# Patient Record
Sex: Female | Born: 1990 | Race: White | Hispanic: No | Marital: Single | Smoking: Never smoker
Health system: Southern US, Community
[De-identification: ages and names within clinical notes are randomized; demographics above are authoritative.]

## PROBLEM LIST (undated history)

## (undated) DIAGNOSIS — F988 Other specified behavioral and emotional disorders with onset usually occurring in childhood and adolescence: Secondary | ICD-10-CM

## (undated) DIAGNOSIS — F419 Anxiety disorder, unspecified: Secondary | ICD-10-CM

## (undated) HISTORY — DX: Other specified behavioral and emotional disorders with onset usually occurring in childhood and adolescence: F98.8

## (undated) HISTORY — DX: Anxiety disorder, unspecified: F41.9

## (undated) HISTORY — PX: CHOLECYSTECTOMY, LAPAROSCOPIC: SHX56

## (undated) HISTORY — PX: OTHER SURGICAL HISTORY: SHX169

---

## 2016-07-19 ENCOUNTER — Ambulatory Visit (INDEPENDENT_AMBULATORY_CARE_PROVIDER_SITE_OTHER): Payer: BLUE CROSS/BLUE SHIELD | Admitting: Nurse Practitioner

## 2016-07-19 ENCOUNTER — Encounter: Payer: Self-pay | Admitting: Nurse Practitioner

## 2016-07-19 VITALS — BP 110/78 | HR 78 | Temp 98.1°F | Ht 63.0 in | Wt 149.0 lb

## 2016-07-19 DIAGNOSIS — Z23 Encounter for immunization: Secondary | ICD-10-CM

## 2016-07-19 DIAGNOSIS — Z0000 Encounter for general adult medical examination without abnormal findings: Secondary | ICD-10-CM | POA: Diagnosis not present

## 2016-07-19 NOTE — Progress Notes (Signed)
Subjective:    Patient ID: Audrey Malone, female    DOB: 02/28/1991, 26 y.o.   MRN: [id number]  Patient presents today for establish care (new patient)  HPI  [facility] [employer] [place] 04[place]2016   ADD: Previous pcp: Dr. Luis Abedriplet in [place].  Last seen 04/2016. Prescribed ritalin to manage anxiety/panic attacks secondary to ADD. Hx of anxiety and panic attacks, no improvement with antianxiety medications. Ritalin initiated at age of 26 per patient.  GYN in [place], last pap smear 2017 (normal per patient). Prescribed OCP. No refill needed at this time.  Acne: [facility]: acne  Since 01/2017. Labs done last week.  Immunizations: (TDAP, Hep C screen, Pneumovax, Influenza, zoster)  Health Maintenance  Topic Date Due  . Pap Smear  12/25/2013  . Flu Shot  07/23/2016*  . HIV Screening  06/23/2017*  . Tetanus Vaccine  07/20/2026  *Topic was postponed. The date shown is not the original due date.   Diet:regular Weight:  Wt Readings from Last 3 Encounters:  07/19/16 149 lb (67.6 kg)   Exercise:walking Fall Risk: Fall Risk  07/19/2016  Falls in the past year? No   Depression/Suicide: [facility] 07/19/2016  Decreased Interest 0  Down, Depressed, Hopeless 0  PHQ - 2 Score 0   Pap Smear (every 7632yrs for >21-29 without HPV, every 8740yrs for >30-55640yrs with HPV):up to date per patient, no hx of abnormal pap. Vision:up to date, last done 12/2015, corrective lens done. Dental:up to date, last done 04/2016 Sexual History (birth control, marital status, STD):single, sexually active  Medications and allergies reviewed with patient and updated if appropriate.  Patient Active Problem List   Diagnosis Date Noted  . Encounter for medical examination to establish care 07/19/2016    No current outpatient prescriptions on file prior to visit.   No current facility-administered medications on file prior to visit.     Past Medical  History:  Diagnosis Date  . ADD (attention deficit disorder)     History reviewed. No pertinent surgical history.  Social History   Social History  . Marital status: Single    Spouse name: N/A  . Number of children: N/A  . Years of education: N/A   Social History Main Topics  . Smoking status: Never Smoker  . Smokeless tobacco: Never Used  . Alcohol use No  . Drug use: No  . Sexual activity: Not Asked   Other Topics Concern  . None   Social History Narrative  . None    Family History  Problem Relation Age of Onset  . Factor V Leiden deficiency Father   . Thyroid disease Paternal Grandfather   . Cancer Maternal Grandmother     unknown         Review of Systems  Constitutional: Negative for fever, malaise/fatigue and weight loss.  HENT: Negative for congestion and sore throat.   Eyes:       Negative for visual changes  Respiratory: Negative for cough and shortness of breath.   Cardiovascular: Negative for chest pain, palpitations and leg swelling.  Gastrointestinal: Negative for blood in stool, constipation, diarrhea and heartburn.  Genitourinary: Negative for dysuria, frequency and urgency.  Musculoskeletal: Negative for falls, joint pain and myalgias.  Skin: Negative for rash.  Neurological: Negative for dizziness, sensory change and headaches.  Endo/Heme/Allergies: Does not bruise/bleed easily.  Psychiatric/Behavioral: Negative for depression, hallucinations, memory loss, substance abuse and suicidal ideas. The patient is not nervous/anxious and does not  have insomnia.     Objective:   Vitals:   07/19/16 1426  BP: 110/78  Pulse: 78  Temp: 98.1 F (36.7 C)    Body mass index is 26.39 kg/m.   Physical Examination:  Physical Exam  Constitutional: She is oriented to person, place, and time.  HENT:  Right Ear: External ear normal.  Left Ear: External ear normal.  Nose: Nose normal.  Mouth/Throat: Oropharynx is clear and moist. No oropharyngeal  exudate.  Eyes: No scleral icterus.  Neck: Normal range of motion. Neck supple.  Cardiovascular: Normal rate, regular rhythm and normal heart sounds.   Pulmonary/Chest: Effort normal and breath sounds normal.  Abdominal: Soft. Bowel sounds are normal.  Musculoskeletal: She exhibits no edema.  Lymphadenopathy:    She has no cervical adenopathy.  Neurological: She is alert and oriented to person, place, and time.  Skin: Skin is warm and dry.  Vitals reviewed.   ASSESSMENT and PLAN:  Aarohi was seen today for establish care.  Diagnoses and all orders for this visit:  Need for diphtheria-tetanus-pertussis (Tdap) vaccine, adult/adolescent -     Tdap vaccine greater than or equal to 7yo IM  Encounter for medical examination to establish care    No problem-specific Assessment & Plan notes found for this encounter.      Follow up: Return if symptoms worsen or fail to improve.  Alysia Penna, NP

## 2016-07-19 NOTE — Progress Notes (Signed)
Pre visit review using our clinic review tool, if applicable. No additional management support is needed unless otherwise documented below in the visit note. 

## 2016-07-19 NOTE — Patient Instructions (Addendum)
Please sign medical release to obtain records from previous pcp(Dr. Triplet), GYN (pap smear results) and [facility] (lab results).  Will need to review records from pcp prior prescribing Ritalin.  STOP act or [place] controlled substance website.

## 2016-07-22 ENCOUNTER — Encounter: Payer: Self-pay | Admitting: Nurse Practitioner

## 2016-07-22 DIAGNOSIS — G43909 Migraine, unspecified, not intractable, without status migrainosus: Secondary | ICD-10-CM | POA: Insufficient documentation

## 2016-07-22 DIAGNOSIS — F988 Other specified behavioral and emotional disorders with onset usually occurring in childhood and adolescence: Secondary | ICD-10-CM | POA: Insufficient documentation

## 2017-05-13 ENCOUNTER — Encounter (HOSPITAL_COMMUNITY): Payer: Self-pay | Admitting: Emergency Medicine

## 2017-05-13 DIAGNOSIS — R1031 Right lower quadrant pain: Secondary | ICD-10-CM | POA: Insufficient documentation

## 2017-05-13 DIAGNOSIS — R197 Diarrhea, unspecified: Secondary | ICD-10-CM | POA: Insufficient documentation

## 2017-05-13 DIAGNOSIS — N898 Other specified noninflammatory disorders of vagina: Secondary | ICD-10-CM | POA: Diagnosis not present

## 2017-05-13 DIAGNOSIS — Z79899 Other long term (current) drug therapy: Secondary | ICD-10-CM | POA: Insufficient documentation

## 2017-05-13 DIAGNOSIS — R112 Nausea with vomiting, unspecified: Secondary | ICD-10-CM | POA: Diagnosis not present

## 2017-05-13 LAB — COMPREHENSIVE METABOLIC PANEL
ALBUMIN: 3.9 g/dL (ref 3.5–5.0)
ALT: 16 U/L (ref 14–54)
ANION GAP: 10 (ref 5–15)
AST: 20 U/L (ref 15–41)
Alkaline Phosphatase: 69 U/L (ref 38–126)
BUN: 8 mg/dL (ref 6–20)
CHLORIDE: 109 mmol/L (ref 101–111)
CO2: 19 mmol/L — AB (ref 22–32)
Calcium: 9.1 mg/dL (ref 8.9–10.3)
Creatinine, Ser: 0.78 mg/dL (ref 0.44–1.00)
Glucose, Bld: 96 mg/dL (ref 65–99)
POTASSIUM: 3.7 mmol/L (ref 3.5–5.1)
Sodium: 138 mmol/L (ref 135–145)
Total Bilirubin: 0.7 mg/dL (ref 0.3–1.2)
Total Protein: 7.4 g/dL (ref 6.5–8.1)

## 2017-05-13 LAB — I-STAT BETA HCG BLOOD, ED (MC, WL, AP ONLY)

## 2017-05-13 LAB — CBC WITH DIFFERENTIAL/PLATELET
BASOS PCT: 0 %
Basophils Absolute: 0 10*3/uL (ref 0.0–0.1)
EOS ABS: 0.2 10*3/uL (ref 0.0–0.7)
Eosinophils Relative: 2 %
HEMATOCRIT: 39.5 % (ref 36.0–46.0)
HEMOGLOBIN: 13.2 g/dL (ref 12.0–15.0)
LYMPHS ABS: 2.5 10*3/uL (ref 0.7–4.0)
Lymphocytes Relative: 23 %
MCH: 31 pg (ref 26.0–34.0)
MCHC: 33.4 g/dL (ref 30.0–36.0)
MCV: 92.7 fL (ref 78.0–100.0)
MONO ABS: 0.6 10*3/uL (ref 0.1–1.0)
Monocytes Relative: 5 %
NEUTROS PCT: 70 %
Neutro Abs: 7.5 10*3/uL (ref 1.7–7.7)
Platelets: 308 10*3/uL (ref 150–400)
RBC: 4.26 MIL/uL (ref 3.87–5.11)
RDW: 14 % (ref 11.5–15.5)
WBC: 10.8 10*3/uL — ABNORMAL HIGH (ref 4.0–10.5)

## 2017-05-13 LAB — URINALYSIS, ROUTINE W REFLEX MICROSCOPIC
BILIRUBIN URINE: NEGATIVE
GLUCOSE, UA: NEGATIVE mg/dL
Ketones, ur: 5 mg/dL — AB
NITRITE: NEGATIVE
PROTEIN: NEGATIVE mg/dL
Specific Gravity, Urine: 1.023 (ref 1.005–1.030)
pH: 5 (ref 5.0–8.0)

## 2017-05-13 LAB — LIPASE, BLOOD: Lipase: 53 U/L — ABNORMAL HIGH (ref 11–51)

## 2017-05-13 NOTE — ED Triage Notes (Signed)
Patient reports mid abdominal pain with nausea , emesis and diarrhea onset last week , pt. added malodorous brown/red vaginal discharge this week , denies fever or chills .

## 2017-05-14 ENCOUNTER — Encounter (HOSPITAL_COMMUNITY): Payer: Self-pay | Admitting: Radiology

## 2017-05-14 ENCOUNTER — Emergency Department (HOSPITAL_COMMUNITY): Payer: PRIVATE HEALTH INSURANCE

## 2017-05-14 ENCOUNTER — Emergency Department (HOSPITAL_COMMUNITY)
Admission: EM | Admit: 2017-05-14 | Discharge: 2017-05-14 | Disposition: A | Discharge status: Home / Self Care | Payer: PRIVATE HEALTH INSURANCE | Attending: Emergency Medicine | Admitting: Emergency Medicine

## 2017-05-14 DIAGNOSIS — R1031 Right lower quadrant pain: Secondary | ICD-10-CM

## 2017-05-14 LAB — WET PREP, GENITAL
Clue Cells Wet Prep HPF POC: NONE SEEN
Sperm: NONE SEEN
TRICH WET PREP: NONE SEEN
Yeast Wet Prep HPF POC: NONE SEEN

## 2017-05-14 MED ORDER — HYDROCODONE-ACETAMINOPHEN 5-325 MG PO TABS
1.0000 | ORAL_TABLET | Freq: Once | ORAL | Status: DC
  Filled 2017-05-14: qty 1

## 2017-05-14 MED ORDER — ONDANSETRON HCL 4 MG PO TABS: 4.0000 mg | ORAL_TABLET | Freq: Three times a day (TID) | ORAL | 0 refills | Status: DC | PRN

## 2017-05-14 MED ORDER — SODIUM CHLORIDE 0.9 % IV BOLUS (SEPSIS)
1000.0000 mL | Freq: Once | INTRAVENOUS | Status: AC
  Administered 2017-05-14: 1000 mL via INTRAVENOUS

## 2017-05-14 MED ORDER — IOPAMIDOL (ISOVUE-300) INJECTION 61%
INTRAVENOUS | Status: AC
  Administered 2017-05-14: 100 mL
  Filled 2017-05-14: qty 100

## 2017-05-14 MED ORDER — DICYCLOMINE HCL 20 MG PO TABS: 20.0000 mg | ORAL_TABLET | Freq: Two times a day (BID) | ORAL | 0 refills | Status: DC | PRN

## 2017-05-14 MED ORDER — KETOROLAC TROMETHAMINE 30 MG/ML IJ SOLN
30.0000 mg | Freq: Once | INTRAMUSCULAR | Status: AC
  Administered 2017-05-14: 30 mg via INTRAVENOUS
  Filled 2017-05-14: qty 1

## 2017-05-14 MED ORDER — DICYCLOMINE HCL 10 MG/ML IM SOLN
20.0000 mg | Freq: Once | INTRAMUSCULAR | Status: AC
  Administered 2017-05-14: 20 mg via INTRAMUSCULAR
  Filled 2017-05-14: qty 2

## 2017-05-14 MED ORDER — ONDANSETRON HCL 4 MG/2ML IJ SOLN
4.0000 mg | Freq: Once | INTRAMUSCULAR | Status: AC
  Administered 2017-05-14: 4 mg via INTRAVENOUS
  Filled 2017-05-14: qty 2

## 2017-05-14 NOTE — ED Notes (Signed)
Per pt and family, pt has been dealing w/ presenting sx since August.  Pt also c/o vaginal discharge.  States that the last time she had vaginal discharge she was dx w/ BV.

## 2017-05-14 NOTE — Discharge Instructions (Signed)
The cause of your pain was not identified today.  It is very important that she get follow-up with your OB/GYN as well as your family doctor.  You will need to be seen by gastroenterology well.  You had a CT scan performed today that showed some fluid and inflammation in your abdomen that may be contributing to your symptoms.

## 2017-05-14 NOTE — ED Notes (Signed)
Pt given cup of water and is tolerating w/o difficulty.

## 2017-05-14 NOTE — ED Provider Notes (Signed)
TIME SEEN: 5:26 AM  CHIEF COMPLAINT: Intermittent abdominal pain, vomiting and diarrhea, vaginal bleeding or discharge  HPI: Patient is a 27 year old female with history of ADD who presents to the emergency department with complaints of intermittent abdominal pain that she describes as feeling like a "wave" of sharp and severe pain with cramping.  Pain is diffuse throughout the abdomen.  She has had symptoms intermittently since the summer 2018.  She has had intermittent vomiting and diarrhea.  Last episode of diarrhea was 3 days ago.  She has had 14 episodes of nonbloody, nonbilious vomiting today.  She also reports vaginal bleeding that started today.  Did have some brown discharge yesterday.  States she is due for her menstrual cycle tomorrow.  She has never been sexually active.  No history of pregnancy or STD.  She has had previous pelvic exams and bacterial vaginosis before.  No previous abdominal surgery.  States that she has an appointment with a gastroenterologist in February.  She has had normal gallbladder ultrasound and normal HIDA scan.  Denies bloody stools, melena, dysuria, hematuria.  ROS: See HPI Constitutional: no fever  Eyes: no drainage  ENT: no runny nose   Cardiovascular:  no chest pain  Resp: no SOB  GI: Diarrhea and vomiting GU: no dysuria Integumentary: no rash  Allergy: no hives  Musculoskeletal: no leg swelling  Neurological: no slurred speech ROS otherwise negative  PAST MEDICAL HISTORY/PAST SURGICAL HISTORY:  Past Medical History:  Diagnosis Date  . ADD (attention deficit disorder)   . ADD (attention deficit disorder)     MEDICATIONS:  Prior to Admission medications   Medication Sig Start Date End Date Taking? Authorizing Provider  ISOtretinoin (CLARAVIS PO) Take 30 mg by mouth 2 (two) times daily.    [provider]  methylphenidate 36 MG PO CR tablet Take 36 mg by mouth 2 (two) times daily.    [provider]  norethindrone-ethinyl  estradiol-iron (BLISOVI FE 1.5/30) 1.5-30 MG-MCG tablet Take 1 tablet by mouth daily.    [provider]    ALLERGIES:  No Known Allergies  SOCIAL HISTORY:  Social History   Tobacco Use  . Smoking status: Never Smoker  . Smokeless tobacco: Never Used  Substance Use Topics  . Alcohol use: No    FAMILY HISTORY: Family History  Problem Relation Age of Onset  . Factor V Leiden deficiency Father   . Thyroid disease Paternal Grandfather   . Cancer Maternal Grandmother        unknown     EXAM: BP 111/79 (BP Location: Right Arm)   Pulse 80   Temp 98.2 F (36.8 C) (Oral)   Resp 15   Ht 5\' 3"  (1.6 m)   Wt 68 kg (150 lb)   LMP 04/14/2017 (Approximate)   SpO2 98%   BMI 26.57 kg/m  CONSTITUTIONAL: Alert and oriented and responds appropriately to questions. Well-appearing; well-nourished HEAD: Normocephalic EYES: Conjunctivae clear, pupils appear equal, EOMI ENT: normal nose; moist mucous membranes NECK: Supple, no meningismus, no nuchal rigidity, no LAD  CARD: RRR; S1 and S2 appreciated; no murmurs, no clicks, no rubs, no gallops RESP: Normal chest excursion without splinting or tachypnea; breath sounds clear and equal bilaterally; no wheezes, no rhonchi, no rales, no hypoxia or respiratory distress, speaking full sentences ABD/GI: Normal bowel sounds; non-distended; soft, non-tender, no rebound, no guarding, no peritoneal signs, no hepatosplenomegaly GU:  Normal external genitalia. No lesions, rashes noted. Patient has small amount of dark red vaginal bleeding on  exam that clots or hemorrhage.  No vaginal discharge.  No adnexal tenderness, mass or fullness, no cervical motion tenderness. Cervix is not appear friable.  Cervix is closed.  Chaperone present for exam. BACK:  The back appears normal and is non-tender to palpation, there is no CVA tenderness EXT: Normal ROM in all joints; non-tender to palpation; no edema; normal capillary refill; no cyanosis, no calf tenderness  or swelling    SKIN: Normal color for age and race; warm; no rash NEURO: Moves all extremities equally PSYCH: The patient's mood and manner are appropriate. Grooming and personal hygiene are appropriate.  MEDICAL DECISION MAKING: Patient here with recurrent episodes of abdominal pain, nausea, vomiting and diarrhea.  Abdominal exam here is completely benign.  Labs show mild leukocytosis but otherwise unremarkable.  Lipase minimally elevated at 53 with normal LFTs.  Urine shows blood but this is likely from her vaginal bleeding.  No other sign of infection.  Wet prep is negative.  Pregnancy test negative.  At this time I do not feel she needs an emergent CT scan.  I agree with close GI follow-up.  We will treat symptomatically with IV fluids, Zofran, Toradol, Bentyl.  Patient and mother are comfortable with this plan.  I doubt acute appendicitis, colitis, diverticulitis, bowel obstruction based on her benign exam.  ED PROGRESS: 7:20 AM  Patient's wet prep is negative for any acute abnormality.  She does have white blood cells but no other abnormality.  Patient reports feeling better but on repeat abdominal exam she now has focal tenderness with voluntary guarding in the right lower quadrant at McBurney's point.  We will proceed with CT imaging to rule out appendicitis.  She had no adnexal tenderness or fullness on exam.  No  cervical motion tenderness.  Signed out to Dr. Madilyn Hookees to follow-up on CT imaging.  Patient and mother comfortable with plan.  She will be kept n.p.o. at this time.   I reviewed all nursing notes, vitals, pertinent previous records, EKGs, lab and urine results, imaging (as available).    Ward, Layla MawKristen N, DO 05/14/17 [phone]

## 2017-05-14 NOTE — ED Provider Notes (Signed)
Patient care assumed at 0730 pending CT abdomen.  CT abdomen obtained and is concerning for PID or ovarian torsion.  PID is not likely given patient's symptoms, history, benign pelvic examination and that she is not sexually active.  Pelvic ultrasound obtained and that is negative for ovarian mass or torsion.  Question if some of the free fluid and inflammation is secondary to underlying inflammatory bowel disease.  Counseled patient on importance of GI as well as OB/GYN follow-up as well as return precautions.   Tilden Fossaees, Sylis Ketchum, MD 05/14/17 1406

## 2017-05-14 NOTE — ED Notes (Signed)
Dr. Ward at bedside.

## 2017-05-14 NOTE — ED Notes (Signed)
PT COUGHED UP SOME PINK TINGED SPUTUM

## 2017-05-15 ENCOUNTER — Encounter: Payer: Self-pay | Admitting: Gastroenterology

## 2017-05-15 LAB — GC/CHLAMYDIA PROBE AMP (~~LOC~~) NOT AT ARMC
CHLAMYDIA, DNA PROBE: NEGATIVE
Neisseria Gonorrhea: NEGATIVE

## 2017-05-24 ENCOUNTER — Encounter: Payer: Self-pay | Admitting: Physician Assistant

## 2017-05-24 ENCOUNTER — Ambulatory Visit: Payer: PRIVATE HEALTH INSURANCE | Admitting: Physician Assistant

## 2017-05-24 ENCOUNTER — Other Ambulatory Visit (INDEPENDENT_AMBULATORY_CARE_PROVIDER_SITE_OTHER): Payer: PRIVATE HEALTH INSURANCE

## 2017-05-24 VITALS — BP 96/78 | HR 80 | Ht 64.0 in | Wt 158.4 lb

## 2017-05-24 DIAGNOSIS — K529 Noninfective gastroenteritis and colitis, unspecified: Secondary | ICD-10-CM

## 2017-05-24 DIAGNOSIS — R109 Unspecified abdominal pain: Secondary | ICD-10-CM

## 2017-05-24 DIAGNOSIS — R9389 Abnormal findings on diagnostic imaging of other specified body structures: Secondary | ICD-10-CM

## 2017-05-24 LAB — CBC WITH DIFFERENTIAL/PLATELET
BASOS ABS: 0 10*3/uL (ref 0.0–0.1)
BASOS PCT: 0.5 % (ref 0.0–3.0)
EOS ABS: 0.2 10*3/uL (ref 0.0–0.7)
Eosinophils Relative: 2.4 % (ref 0.0–5.0)
HEMATOCRIT: 40.8 % (ref 36.0–46.0)
Hemoglobin: 13.9 g/dL (ref 12.0–15.0)
LYMPHS ABS: 2.1 10*3/uL (ref 0.7–4.0)
Lymphocytes Relative: 30.7 % (ref 12.0–46.0)
MCHC: 34 g/dL (ref 30.0–36.0)
MCV: 93 fl (ref 78.0–100.0)
MONO ABS: 0.6 10*3/uL (ref 0.1–1.0)
Monocytes Relative: 8.6 % (ref 3.0–12.0)
NEUTROS ABS: 3.9 10*3/uL (ref 1.4–7.7)
NEUTROS PCT: 57.8 % (ref 43.0–77.0)
PLATELETS: 322 10*3/uL (ref 150.0–400.0)
RBC: 4.38 Mil/uL (ref 3.87–5.11)
RDW: 13.8 % (ref 11.5–15.5)
WBC: 6.7 10*3/uL (ref 4.0–10.5)

## 2017-05-24 LAB — HIGH SENSITIVITY CRP: CRP, High Sensitivity: 2.74 mg/L (ref 0.000–5.000)

## 2017-05-24 LAB — IGA: IGA: 146 mg/dL (ref 68–378)

## 2017-05-24 NOTE — Patient Instructions (Signed)
Please go to the basement level to have your labs drawn.   Take Bentyl 10 mg, 1 tablet every 6 hours as needed for abdominal pain'cramping, Eat a soft diet for now. You will be established with Dr. Scarlette Shorts. You may need a colonoscopy. We will determine that after the CT Enterography.    You have been scheduled for a CT scan of the abdomen and pelvis at [place] ([address]---this is in the same building as Press photographer).   You are scheduled on Thursday 06-01-2017 at 3:00 PM. You should arrive at 1:30 PM to your appointment time for registration. Please follow the written instructions below on the day of your exam:  WARNING: IF YOU ARE ALLERGIC TO IODINE/X-RAY DYE, PLEASE NOTIFY RADIOLOGY IMMEDIATELY AT [phone]! YOU WILL BE GIVEN A 13 HOUR PREMEDICATION PREP.  1) Do not eat after 11:00 am anything after  (4 hours prior to your test) 2) You have been given 2 bottles of oral contrast to drink. The solution may taste               better if refrigerated, but do NOT add ice or any other liquid to this solution. Shake             well before drinking.    Drink 1 bottle of contrast @ 1:00 Pm (2 hours prior to your exam)  Drink 1 bottle of contrast @ 2:00 PM (1 hour prior to your exam)  You may take any medications as prescribed with a small amount of water except for the following: Metformin, Glucophage, Glucovance, Avandamet, Riomet, Fortamet, Actoplus Met, Janumet, Glumetza or Metaglip. The above medications must be held the day of the exam AND 48 hours after the exam.  The purpose of you drinking the oral contrast is to aid in the visualization of your intestinal tract. The contrast solution may cause some diarrhea. Before your exam is started, you will be given a small amount of fluid to drink. Depending on your individual set of symptoms, you may also receive an intravenous injection of x-ray contrast/dye. Plan on being at [facility] for 30 minutes or  long, depending on the type of exam you are having performed.  If you have any questions regarding your exam or if you need to reschedule, you may call the CT department at [phone] between the hours of 8:00 am and 5:00 pm, Monday-Friday.  ________________________________________________________________________

## 2017-05-24 NOTE — Progress Notes (Signed)
Subjective:    Patient ID: Audrey Malone, female    DOB: 02/09/1991, 27 y.o.   MRN: [id number]  HPI Audrey Malone is a pleasant 27 year old white female, new to GI today, referred by emergency room/Deport/Audrey Malone after evaluation there on 01/20/2019Patient has not had any prior GI evaluation. She is generally in good health with history of ADD and migraines. Patient says she had acute onset of abdominal pain the evening prior to the ER visit. Pain was crampy and located more in the right side of her abdomen, she was uncomfortable all through that night. Following morning she tried to eat and pain became much worse immediately after eating. She went to the emergency room where she says she had about 14 episodes of vomiting which she says was because of the pain. She did not have any diarrhea at that time no fever or chills. She had CT of the abdomen and pelvis done which showed wall thickening in a loop of ileum in the right pelvis adjacent to a fluid cllection arising from the right adnexa. Question of PID was raised Patient had pelvic exam and STD workup all of which was negative. She then had pelvic ultrasound with Doppler imaging showing a normal right ovary, normal left ovary and moderate free pelvic fluid. Labs with a CBC of 10.9 hemoglobin 13.2 and chemistries unremarkable. Patient says that she has continued to have abdominal discomfort which has not been severe area she's been eating very bland. She has not had any fever or chills and no further nausea or vomiting. She has had a change in her bowel habits over the past 6 months or so with episodes of constipation and episodes of diarrhea. Currently she is feeling somewhat constipated and having small bowel movements. She says she has been feeling bloated in her abdomen and uncomfortable for several months. She had been on Accutane and in March 2018 the dose was increased. She says she started noticing abdominal bloating around that same time.  Eventually she was taken off of Accutane in June 2018 but the bloating and abdominal discomfort persisted. She says that she also had an eye infection in August and then ever since then has had somewhat irritated reddened eyes and has been unable to wear her contacts. She has seen an ophthalmologist and is scheduled to see someone in [place] in a couple of weeks. There's been some question of an allergic reaction. 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 Mother also relates that patient had abdominal ultrasound and then a HIDA scan done in [place] in November 2018 for the same symptoms.  Review of Systems Pertinent positive and negative review of systems were noted in the above HPI section.  All other review of systems was otherwise negative.  Outpatient Encounter Medications as of 05/24/2017  Medication Sig  . methylphenidate 36 MG PO CR tablet Take 36 mg by mouth 2 (two) times daily.  . norethindrone-ethinyl estradiol-iron (BLISOVI FE 1.5/30) 1.5-30 MG-MCG tablet Take 1 tablet by mouth daily.  Marland Kitchen. dicyclomine (BENTYL) 20 MG tablet Take 1 tablet (20 mg total) by mouth 2 (two) times daily as needed for spasms. (Patient not taking: Reported on 05/24/2017)  . ondansetron (ZOFRAN) 4 MG tablet Take 1 tablet (4 mg total) by mouth every 8 (eight) hours as needed for nausea or vomiting. (Patient not taking: Reported on 05/24/2017)  . [DISCONTINUED] ISOtretinoin (CLARAVIS PO) Take 30  mg by mouth 2 (two) times daily.   No facility-administered encounter medications on file as of 05/24/2017.    No Known Allergies Patient Active Problem List   Diagnosis Date Noted  . 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 Migraine 07/22/2016  . Encounter for medical examination to establish care 07/19/2016   Social History   Socioeconomic History  . Marital status: Single    Spouse  name: Not on file  . Number of children: 0  . Years of education: Not on file  . Highest education level: Not on file  Social Needs  . Financial resource strain: Not on file  . Food insecurity - worry: Not on file  . Food insecurity - inability: Not on file  . Transportation needs - medical: Not on file  . Transportation needs - non-medical: Not on file  Occupational History  . Occupation: Consulting civil engineer  Tobacco Use  . Smoking status: Never Smoker  . Smokeless tobacco: Never Used  Substance and Sexual Activity  . Alcohol use: No  . Drug use: No  . Sexual activity: Not on file  Other Topics Concern  . Not on file  Social History Narrative  . Not on file    Audrey Malone's family history includes Arthritis in her paternal grandmother; Cancer in her maternal grandmother; Factor V Leiden deficiency in her father; Heart attack in her maternal grandfather; Heart disease in her maternal grandfather; Hypercholesterolemia in her paternal grandmother; Thyroid disease in her paternal grandfather.      Objective:    Vitals:   05/24/17 1348  BP: 96/78  Pulse: 80    Physical Exam ;well-developed young white female in no acute distress, accompanied by her mother are both pleasant blood pressure 96/78 pulse 80, height 5 foot 4, weight 158, BMI 27.1. HEENT; nontraumatic normocephalic EOMI PERRLA sclera anicteric, Cardiovascular ;regular rate and rhythm with S1-S2 no murmur or gallop, Pulmonary; clear bilaterally, Abdomen;soft, she has a rather generalized tenderness, but is more tender in the right mid and right lower quadrant is no palpable mass no guarding or rebound bowel sounds are present, Rectal ;exam not done, Extremities ;no clubbing cyanosis or edema skin warm and dry, Neuropsych ;mood and affect appropriate       Assessment & Plan:   #38 27 year old white female with several month history of vague GI symptoms with abdominal discomfort and bloating some change in bowel habits, and now with  a more acute episode of right lower abdominal pain dictating ER visit on 05/14/2017. CT imaging showed wall thickening in a loop of ileum in the right pelvis and a pelvic fluid collection. GYN evaluation was negative including ultrasound with Dopplers and infectious workup Will need to rule out IBD/Crohn's, rule out autoimmune disease  #2 bilateral eye irritation times several months-awaiting ophthalmology evaluation in [facility]   nclear whether this could be associated with her GI symptoms I.e. Iritis or  Scleritis  #3 ADD  Plan;  CBC today, sedimentation rate, CRP, TTG, IgA and ANA Patient will be scheduled for CT enterography We discussed possible need for colonoscopy and intubation of the terminal ileum. This would be scheduled with Dr. Marina Goodell Patient has a prescription for Bentyl, she will use this on an when necessary basis and continue Tylenol as needed Continue soft blander  Diet  Kazuo Durnil S Immaculate Crutcher PA-C 05/24/2017   Cc: Anne Ng, NP

## 2017-05-25 LAB — TISSUE TRANSGLUTAMINASE, IGG: (tTG) Ab, IgG: 1 U/mL

## 2017-05-25 LAB — ANA: ANA: NEGATIVE

## 2017-05-25 LAB — SEDIMENTATION RATE: SED RATE: 9 mm/h (ref 0–20)

## 2017-05-25 NOTE — Progress Notes (Signed)
Assessment and plans reviewed  

## 2017-06-01 ENCOUNTER — Ambulatory Visit (INDEPENDENT_AMBULATORY_CARE_PROVIDER_SITE_OTHER)
Admission: RE | Admit: 2017-06-01 | Discharge: 2017-06-01 | Disposition: A | Discharge status: Home / Self Care | Payer: 59 | Source: Ambulatory Visit | Attending: Physician Assistant | Admitting: Physician Assistant

## 2017-06-01 DIAGNOSIS — R9389 Abnormal findings on diagnostic imaging of other specified body structures: Secondary | ICD-10-CM | POA: Diagnosis not present

## 2017-06-01 DIAGNOSIS — K529 Noninfective gastroenteritis and colitis, unspecified: Secondary | ICD-10-CM | POA: Diagnosis not present

## 2017-06-01 DIAGNOSIS — R109 Unspecified abdominal pain: Secondary | ICD-10-CM | POA: Diagnosis not present

## 2017-06-01 MED ORDER — IOPAMIDOL (ISOVUE-300) INJECTION 61%
100.0000 mL | Freq: Once | INTRAVENOUS | Status: AC | PRN
  Administered 2017-06-01: 100 mL via INTRAVENOUS

## 2017-06-09 ENCOUNTER — Ambulatory Visit: Payer: PRIVATE HEALTH INSURANCE | Admitting: Gastroenterology

## 2017-06-22 ENCOUNTER — Encounter: Payer: Self-pay | Admitting: Physician Assistant

## 2017-06-22 ENCOUNTER — Encounter: Payer: Self-pay | Admitting: Allergy

## 2017-06-22 ENCOUNTER — Ambulatory Visit: Payer: PRIVATE HEALTH INSURANCE | Admitting: Physician Assistant

## 2017-06-22 ENCOUNTER — Ambulatory Visit (INDEPENDENT_AMBULATORY_CARE_PROVIDER_SITE_OTHER): Payer: 59 | Admitting: Allergy

## 2017-06-22 VITALS — BP 102/68 | HR 76 | Temp 98.5°F | Resp 16 | Ht 63.75 in | Wt 159.2 lb

## 2017-06-22 VITALS — BP 92/58 | HR 76 | Ht 64.0 in | Wt 161.4 lb

## 2017-06-22 DIAGNOSIS — H5789 Other specified disorders of eye and adnexa: Secondary | ICD-10-CM | POA: Diagnosis not present

## 2017-06-22 DIAGNOSIS — R1031 Right lower quadrant pain: Secondary | ICD-10-CM

## 2017-06-22 DIAGNOSIS — R933 Abnormal findings on diagnostic imaging of other parts of digestive tract: Secondary | ICD-10-CM | POA: Diagnosis not present

## 2017-06-22 DIAGNOSIS — R140 Abdominal distension (gaseous): Secondary | ICD-10-CM | POA: Diagnosis not present

## 2017-06-22 DIAGNOSIS — T781XXA Other adverse food reactions, not elsewhere classified, initial encounter: Secondary | ICD-10-CM

## 2017-06-22 MED ORDER — DICYCLOMINE HCL 10 MG PO CAPS: ORAL_CAPSULE | ORAL | 2 refills | Status: DC

## 2017-06-22 NOTE — Patient Instructions (Addendum)
We have sent the following medications to your pharmacy for you to pick up at your convenience:[facility] [address]. 1. Bentyl ( Dicyclomine) 10 mg  Call back and ask to speak to Amy's nurse Beth, if you develop recurrent pain.  Colonoscopy with Dr. Marina GoodellPerry would be he next step.  [phone], choose option 2.

## 2017-06-22 NOTE — Patient Instructions (Addendum)
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  All other foods tested are negative.       - it is not likely that garlic ingestion is leading to GI symptoms as if true IgE allergy you should have symptoms (respiratory, GI, skin or cardiovascular) after every ingestion of garlic    - can trial garlic out of the diet for about a month a see if this improves your GI symptoms.  If not would put back into diet.      - agree with further work-up with GI including colonoscopy  Red and dry eyes    - environmental allergy skin testing is positive to grass pollen.      - Allergen avoidance measures provided    - trial use of Opcon-A or Naphcon-A 1-2 drops per eye up to 4 times a day  Follow-up 6 months or sooner for eye symptoms

## 2017-06-22 NOTE — Progress Notes (Signed)
New Patient Note  RE: Audrey Malone MRN: [id number] DOB: 27-Nov-1990 Date of Office Visit: 06/22/2017  Referring provider: Leisa Lenz Primary care provider: Leisa Lenz  Chief Complaint: Food Allergy Testing  History of present illness: Audrey Malone is a 27 y.o. female presenting today for evaluation of possible food allergy. She reports a one year history of abdominal bloating and lower abdominal crampy pain. She has been unable to correlate her symptoms to any foods but does note that her symptoms do seem to be triggered by eating. She denies any hives, throat swelling, wheezing, shortness of breath, lip swelling, lightheadedness or syncope. She does note an erythematous macular rash that appears on her upper arms and legs at times that is pruritic and sometimes painful. She reports she has had one episode of nausea and vomiting when she presented to the ED earlier this year in January. She denies any hematochezia, melena, or diarrhea. Does note constipation. She does note that she was on Accutane with an increase in dosing around the time of her symptom presentation but has been off Accutane since June 2018 with no improvement in symptoms. She has been evaluated by GI and noted to have thickening in a loop of ileum and a pelvic fluid collection. GYN work up was negative. She has had normal ESR, CRP, ANA, IgA, tTG Ab.  She does have bentyl to use as needed for abd pain.  Additionally, she notes dry and red eyes that she has been seen by optometry for and has tried 3 separate eye drops (she is unclear what they were) without benefit.   Review of systems: Review of Systems  Constitutional: Negative for chills, fever, malaise/fatigue and weight loss.  HENT: Negative for congestion, ear discharge, ear pain, nosebleeds, sinus pain and sore throat.   Eyes: Positive for redness. Negative for blurred vision, double vision, photophobia, pain and discharge.  Respiratory: Negative for cough,  shortness of breath and wheezing.   Cardiovascular: Negative for chest pain.  Gastrointestinal: Positive for abdominal pain and constipation. Negative for blood in stool, diarrhea, heartburn, melena, nausea and vomiting.  Musculoskeletal: Negative for joint pain and myalgias.  Skin: Positive for itching and rash.  Neurological: Negative for headaches.    All other systems negative unless noted above in HPI  Past medical history: Past Medical History:  Diagnosis Date  . ADD (attention deficit disorder)   . Anxiety     Past surgical history: Past Surgical History:  Procedure Laterality Date  . None      Family history:  Family History  Problem Relation Age of Onset  . Factor V Leiden deficiency Father   . Thyroid disease Paternal Grandfather   . Cancer Maternal Grandmother        bladder  . Food Allergy Mother        fish  . Heart disease Maternal Grandfather   . Heart attack Maternal Grandfather   . Arthritis Paternal Grandmother   . Hypercholesterolemia Paternal Grandmother   . Colon cancer Neg Hx   . Liver cancer Neg Hx   . Allergic rhinitis Neg Hx   . Asthma Neg Hx   . Eczema Neg Hx   . Urticaria Neg Hx   . Angioedema Neg Hx     Social history: She lives in an apartment with carpeting with electric heating and central cooling.  No pets in the home.  No concern for water damage, mildew or roaches in the home.  She is a Insurance underwriter.  Denies smoking history.    Medication List: Allergies as of 06/22/2017   No Known Allergies     Medication List        Accurate as of 06/22/17 12:01 PM. Always use your most recent med list.          BLISOVI FE 1.5/30 1.5-30 MG-MCG tablet Generic drug:  norethindrone-ethinyl estradiol-iron Take 1 tablet by mouth daily.   dicyclomine 20 MG tablet Commonly known as:  BENTYL Take 1 tablet (20 mg total) by mouth 2 (two) times daily as needed for spasms.   methylphenidate 36 MG CR tablet Commonly known as:  CONCERTA Take  36 mg by mouth 2 (two) times daily.   ondansetron 4 MG tablet Commonly known as:  ZOFRAN Take 1 tablet (4 mg total) by mouth every 8 (eight) hours as needed for nausea or vomiting.       Known medication allergies: No Known Allergies   Physical examination: Blood pressure 102/68, pulse 76, temperature 98.5 F (36.9 C), temperature source Oral, resp. rate 16, height 5' 3.75" (1.619 m), weight 159 lb 3.2 oz (72.2 kg).  General: Alert, interactive, in no acute distress. HEENT: PERRLA, TMs pearly gray without scleral or conjunctiva injection, turbinates non-edematous without discharge, post-pharynx non erythematous. Neck: Supple without lymphadenopathy. Lungs: Clear to auscultation without wheezing, rhonchi or rales. {no increased work of breathing. CV: Normal S1, S2 without murmurs. Abdomen: Nondistended, nontender. Skin: Warm and dry, without lesions or rashes. Extremities:  No clubbing, cyanosis or edema. Neuro:   Grossly intact.  Diagnositics/Labs: Labs:  Component     Latest Ref Rng & Units 05/24/2017  WBC     4.0 - 10.5 K/uL 6.7  RBC     3.87 - 5.11 Mil/uL 4.38  Hemoglobin     12.0 - 15.0 g/dL 13.9  HCT     36.0 - 46.0 % 40.8  MCV     78.0 - 100.0 fl 93.0  MCHC     30.0 - 36.0 g/dL 34.0  RDW     11.5 - 15.5 % 13.8  Platelets     150.0 - 400.0 K/uL 322.0  Neutrophils     43.0 - 77.0 % 57.8  Lymphocytes     12.0 - 46.0 % 30.7  Monocytes Relative     3.0 - 12.0 % 8.6  Eosinophil     0.0 - 5.0 % 2.4  Basophil     0.0 - 3.0 % 0.5  NEUT#     1.4 - 7.7 K/uL 3.9  Lymphocyte #     0.7 - 4.0 K/uL 2.1  Monocyte #     0.1 - 1.0 K/uL 0.6  Eosinophils Absolute     0.0 - 0.7 K/uL 0.2  Basophils Absolute     0.0 - 0.1 K/uL 0.0  Sed Rate     0 - 20 mm/h 9  Anit Nuclear Antibody(ANA)     NEGATIVE NEGATIVE  IgA     68 - 378 mg/dL 146  C-Reactive Protein, Cardiac     0.000 - 5.000 mg/L 2.740  (tTG) Ab, IgG     U/mL 1    Allergy testing: environmental  allergy skin prick testing is positive to [place].   Select food allergy testing is slightly positive to garlic with erythema only Allergy testing results were read and interpreted by provider, documented by clinical staff.   Assessment and plan:   Adverse food reaction    - food allergy testing today is  slightly positive for garlic.  All other foods tested are negative.       - it is not likely that garlic ingestion is leading to GI symptoms as if true IgE allergy you should have symptoms (respiratory, GI, skin or cardiovascular) after every ingestion of garlic    - can trial garlic out of the diet for about a month a see if this improves your GI symptoms.  If not would put back into diet.      - agree with further work-up with GI including colonoscopy  Red and dry eyes    - environmental allergy skin testing is positive to grass pollen.      - Allergen avoidance measures provided    - trial use of Opcon-A or Naphcon-A 1-2 drops per eye up to 4 times a day  Follow-up 6 months or sooner for eye symptoms  I appreciate the opportunity to take part in Audrey Malone's care. Please do not hesitate to contact me with questions.  Sincerely,   Prudy Feeler, MD Allergy/Immunology [facility]

## 2017-06-23 ENCOUNTER — Encounter: Payer: Self-pay | Admitting: Physician Assistant

## 2017-06-23 NOTE — Progress Notes (Signed)
Subjective:    Patient ID: Audrey Malone, female    DOB: 02/13/1991, 27 y.o.   MRN: [id number]  HPI Audrey Malone is a pleasant 27 year old white female, who was initially seen in the office on 05/24/2017 by myself. Also established with Dr. Marina GoodellPerry. At that time she had presented with 1 month history of somewhat vague GI symptoms with abdominal discomfort which were then followed by an acute episode of fairly intense right lower quadrant pain. She had undergone CT of the abdomen and pelvis with an ER visit which did show wall thickening in a loop of ileum located in the right pelvis and there was also a pelvic fluid collection arising from the area of the right adnexa. There is no similar inflammation elsewhere in the bowel no other abnormal bowel loops. No Obstruction. The right ovary appeared ill-defined but not enlarged, appendix normal. She was referred to GYN. And says that she had GYN evaluation which was unremarkable. She had pelvic ultrasound also done on 05/14/2017 that showed moderate free pelvic fluid as on CT but otherwise normal, but otherwise normal ultrasound. After being seen here she was started on Bentyl, Tylenol and a bland diet. We did baseline labs which were negative, also did ANA, TTG and IgA all of which were negative. She was scheduled for CT enterography which was done on 06/01/2017 and read as normal. She comes back in today for follow-up. She says she has been very careful about her diet and has eliminated multiple things including gr, lettuce ice cream soda chocolate and says that is helped with the bloating. She also saw an allergist today for food allergy testing and says that she was only slightly positive to garlic. Skin is use to have some difficulty with abdominal bloating, is unhappy that she has gained some weight. She is not having any further episodes of diarrhea and is having one bowel movement per day. She denies any significant abdominal pain. She has been using MiraLAX  occasionally.  Review of Systems Pertinent positive and negative review of systems were noted in the above HPI section.  All other review of systems was otherwise negative.  Outpatient Encounter Medications as of 06/22/2017  Medication Sig  . dicyclomine (BENTYL) 20 MG tablet Take 1 tablet (20 mg total) by mouth 2 (two) times daily as needed for spasms.  . methylphenidate 36 MG PO CR tablet Take 36 mg by mouth 2 (two) times daily.  . norethindrone-ethinyl estradiol-iron (BLISOVI FE 1.5/30) 1.5-30 MG-MCG tablet Take 1 tablet by mouth daily.  . ondansetron (ZOFRAN) 4 MG tablet Take 1 tablet (4 mg total) by mouth every 8 (eight) hours as needed for nausea or vomiting.  . dicyclomine (BENTYL) 10 MG capsule Take 1 tab every 6 hours as needed for abdominal cramping   No facility-administered encounter medications on file as of 06/22/2017.    No Known Allergies Patient Active Problem List   Diagnosis Date Noted  . 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 Migraine 07/22/2016  . Encounter for medical examination to establish care 07/19/2016   Social History   Socioeconomic History  . Marital status: Single    Spouse name: Not on file  . Number of children: 0  . Years of education: Not on file  . Highest education level: Not on file  Social Needs  . Financial resource strain: Not on file  . Food insecurity - worry: Not on file  . Food insecurity - inability: Not on file  . Transportation  needs - medical: Not on file  . Transportation needs - non-medical: Not on file  Occupational History  . Occupation: Consulting civil engineer  Tobacco Use  . Smoking status: Never Smoker  . Smokeless tobacco: Never Used  Substance and Sexual Activity  . Alcohol use: No  . Drug use: No  . Sexual activity: Not on file  Other Topics Concern  . Not on file  Social History Narrative  . Not on file    Audrey Malone's family history includes Arthritis in her paternal grandmother; Cancer in her  maternal grandmother; Factor V Leiden deficiency in her father; Food Allergy in her mother; Heart attack in her maternal grandfather; Heart disease in her maternal grandfather; Hypercholesterolemia in her paternal grandmother; Thyroid disease in her paternal grandfather.      Objective:    Vitals:   06/22/17 1501  BP: (!) 92/58  Pulse: 76    Physical Exam  well-developed young white female in no acute distress, 6 accompanied by her mother both pleasant blood pressure 92/58, height 5 foot 4, weight 161, BMI 27.7. HEENT ;nontraumatic normocephalic EOMI PERRLA sclera anicteric, Cardiovascular; regular rate and rhythm with S1-S2 no murmur rub or gallop, Pulmonary; clear bilaterally, Abdomen ;soft, bowel sounds are present there is no palpable mass or hepatosplenomegaly she does have some tenderness in the right mid and right lower quadrant. Rectal ;exam not done, Ext; no clubbing cyanosis or edema skin warm and dry, Neuropsych ;mood and affect appropriate       Assessment & Plan:   #71 27 year old female with 2 month history of negative abdominal discomfort, associated bloating and then an episode of severe right lower quadrant pain which took her to the ER. CT scan at that time done on 05/14/2017 showed wall thickening in a loop of ileum in the right pelvis and an adjacent pelvic fluid collection in the right adnexa. Pelvic ultrasound was negative for any ovarian pathology. CT enterography done 3 weeks later on 06/01/2017 was read as normal. Inflammatory markers and celiac markers negative. Patient continues to have mild abdominal bloating, no diarrhea However she is still definitely tender in the right lower and right mid quadrant. Etiology is not clear, no evidence of Crohn's disease by enterography or other small bowel edema.  #2 ADD #3 migraines  Plan; I discussed options with the patient and her mother. We could proceed with colonoscopy and intubation of the terminal ileum, versus  watchful waiting in the short-term. Patient would like to hold off on colonoscopy and see how she does over the next month or 2. We will refill Bentyl 10 mg by mouth 3 times a day on an as-needed basis. She'll continue to avoid known triggers for her bloating. She is asked to call should she develop any recurrence of ongoing right lower quadrant pain. At that time we  may need to repeat CT  imaging and/or proceed with colonoscopy.   Amy S Esterwood PA-C 06/23/2017   Cc: Anne Ng, NP

## 2017-06-23 NOTE — Progress Notes (Signed)
Assessment and plans reviewed  

## 2018-08-08 IMAGING — CT CT ENTEROGRAPHY (ABD-PELV W/ CM)
2 of 5 series · 15 of 46 positions shown, 17 images · IV contrast (ISOVUE 300)
Comparison: 05/14/2017

CLINICAL DATA: Severe abdominal pain and nausea and vomiting for 1
month. Intermittent diarrhea. Crohn's disease.

EXAM:
CT ABDOMEN AND PELVIS WITH CONTRAST (ENTEROGRAPHY)
TECHNIQUE: Multidetector CT of the abdomen and pelvis during bolus
administration of intravenous contrast. Negative oral contrast was
given.
CONTRAST:  100mL O7WVE5-RSS IOPAMIDOL (O7WVE5-RSS) INJECTION 61%

[Series 4: entero thins · axial · 0.73mm/px · z∈[-465,-17]mm · 12 of 250 slices shown, 14 images]
[im 13/250  soft-tissue]
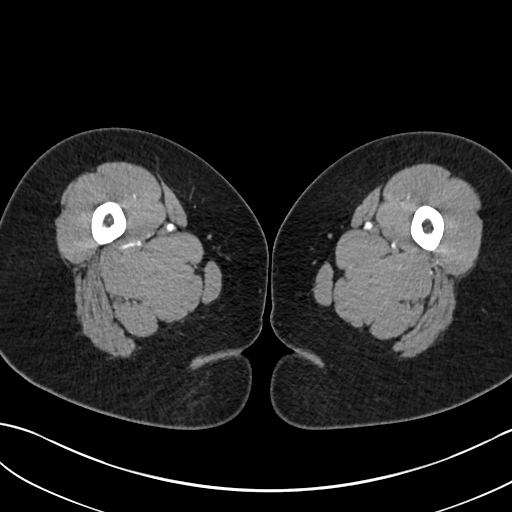
[im 13/250  bone]
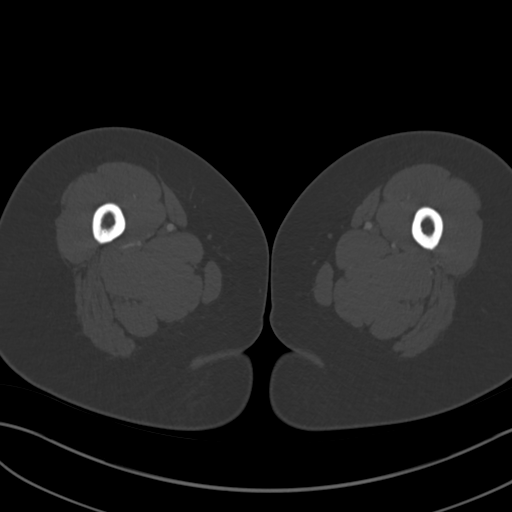
[im 38/250  soft-tissue]
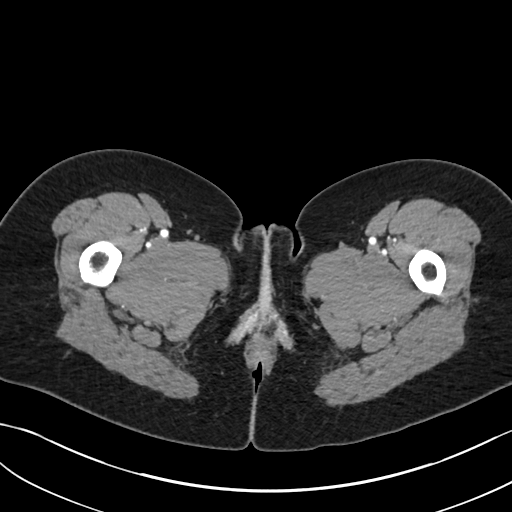
[im 50/250  soft-tissue]
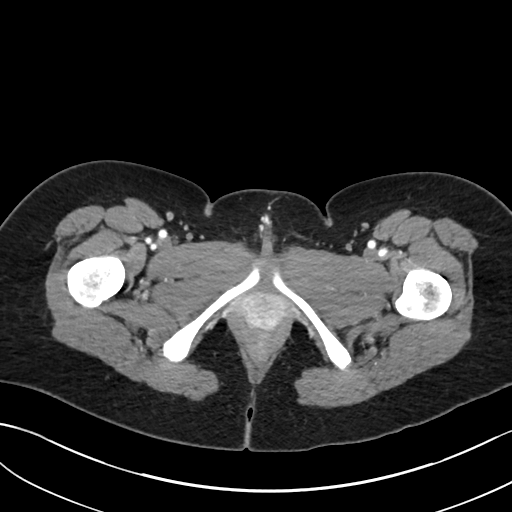
[im 75/250  soft-tissue]
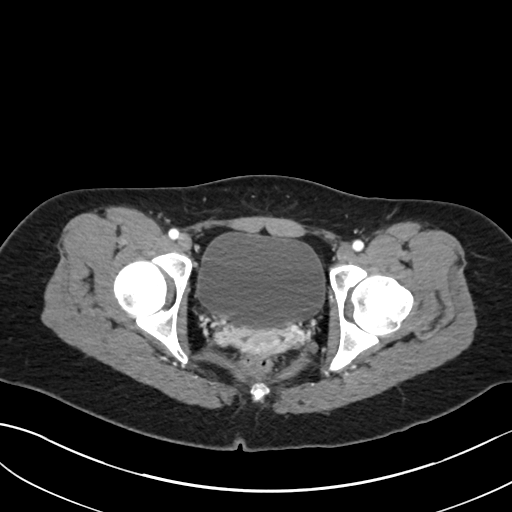
[im 100/250  soft-tissue]
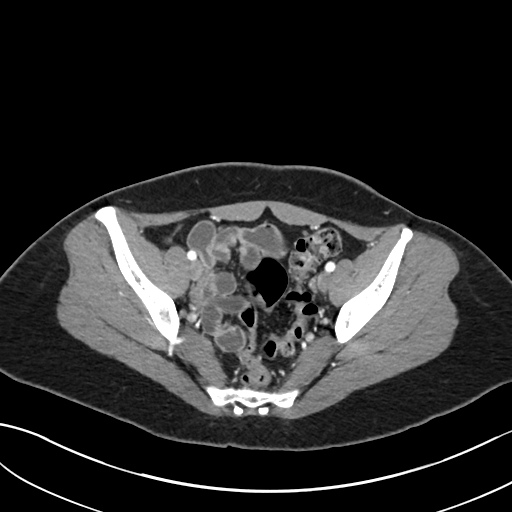
[im 113/250  soft-tissue]
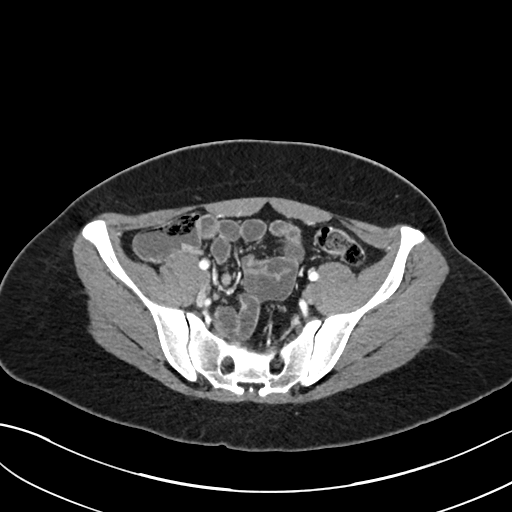
[im 137/250  soft-tissue]
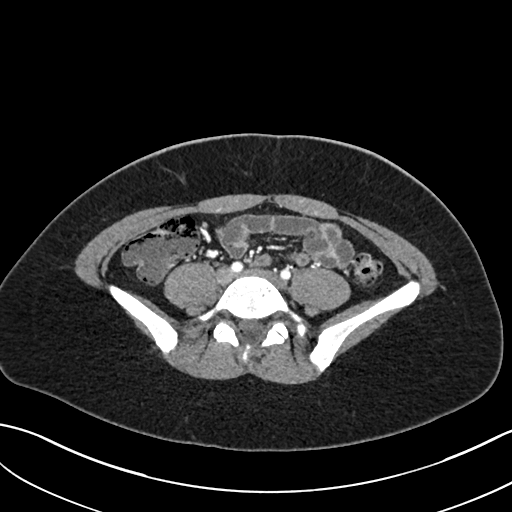
[im 150/250  soft-tissue]
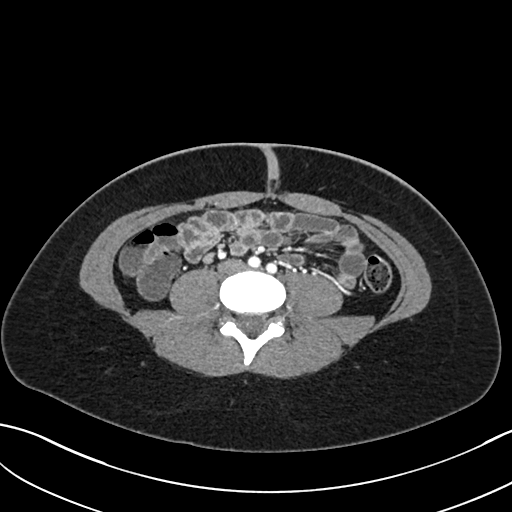
[im 175/250  soft-tissue]
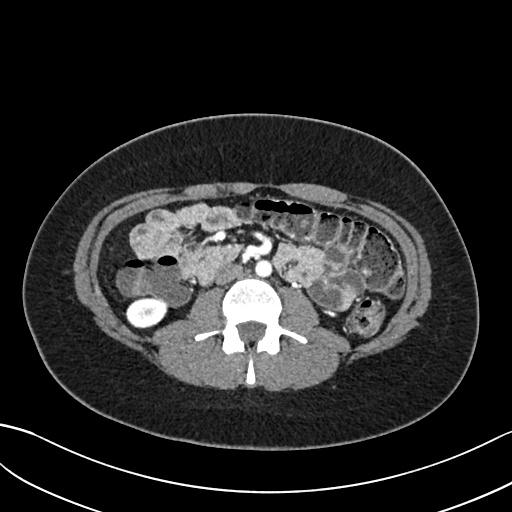
[im 175/250  bone]
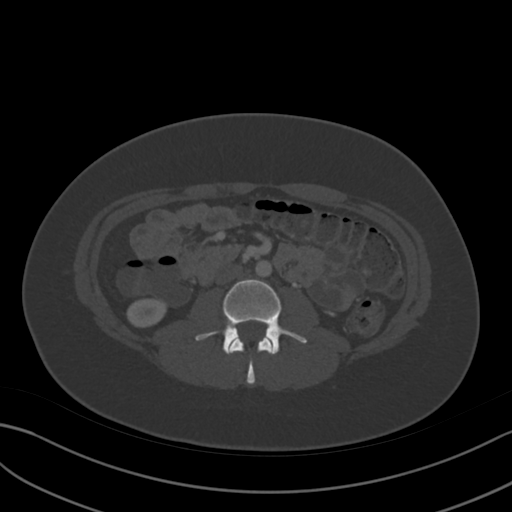
[im 200/250  soft-tissue]
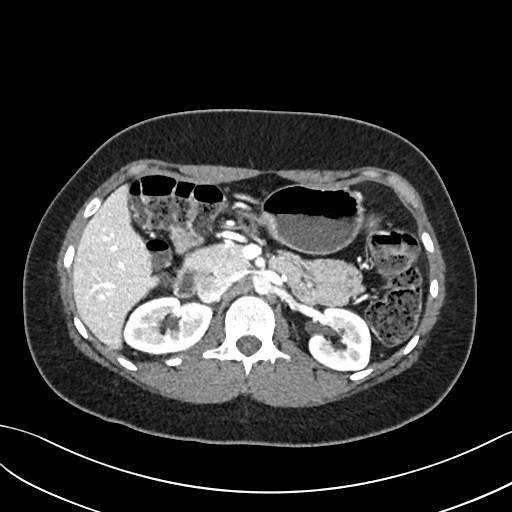
[im 212/250  soft-tissue]
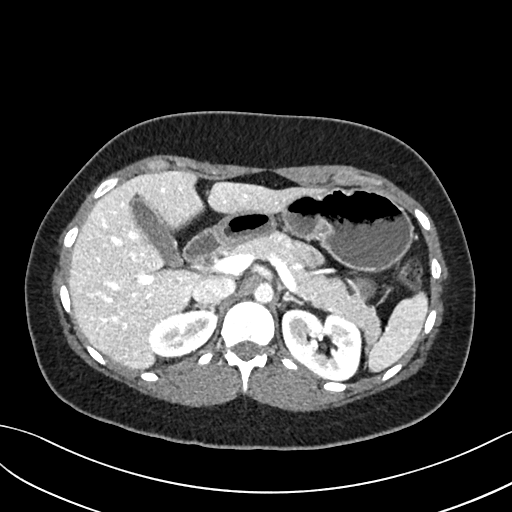
[im 237/250  soft-tissue]
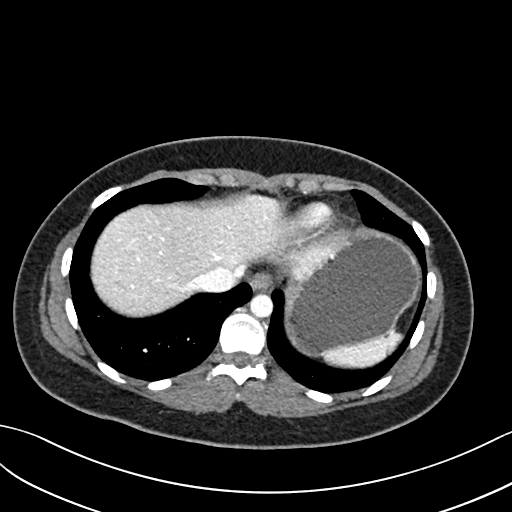

[Series 6: coronal · coronal · 0.62mm/px · 3 of 62 slices shown]
[im 21/62  soft-tissue]
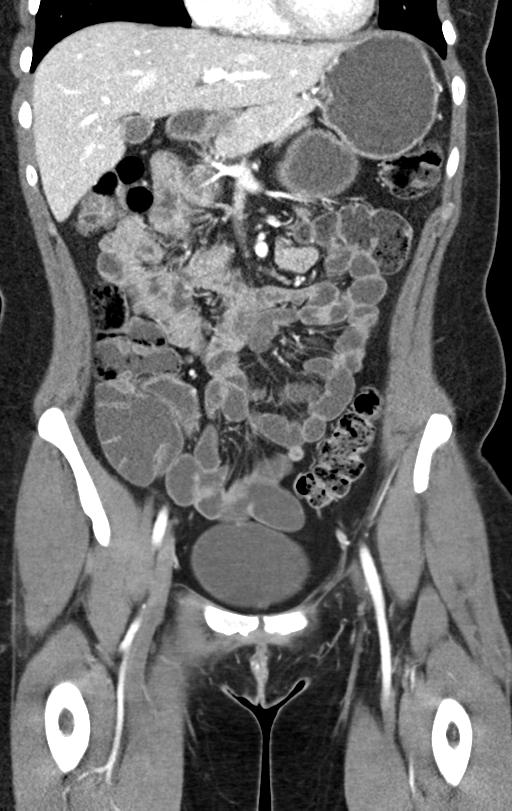
[im 28/62  soft-tissue]
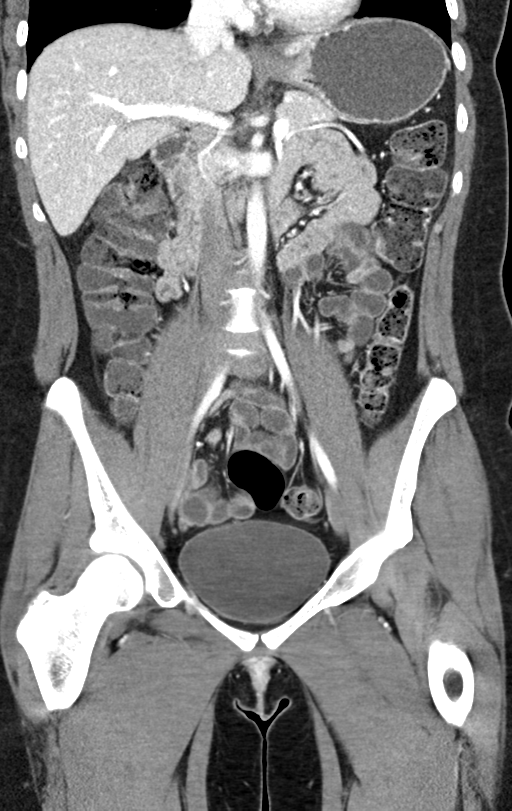
[im 34/62  soft-tissue]
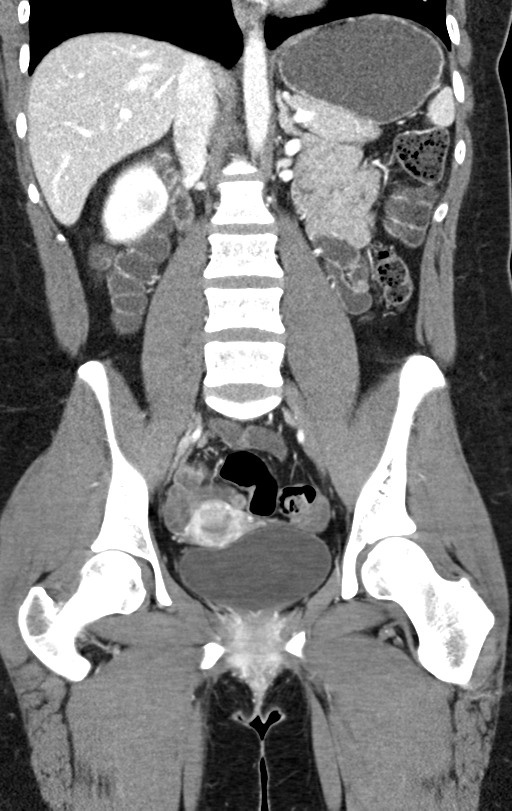

[15 of 46 positions shown; findings below may reference images not displayed]

FINDINGS: Lower Chest: No acute findings.

Hepatobiliary: No hepatic masses identified. Gallbladder is
unremarkable.

Pancreas:  No mass or inflammatory changes.

Spleen: Within normal limits in size and appearance.

Adrenals/Urinary Tract: No masses identified. No evidence of
hydronephrosis.

Stomach/Bowel: No evidence of small bowel wall thickening, increased
contrast enhancement, or dilatation. No evidence of mesenteric
inflammatory changes, enteric fistula, or abnormal fluid
collections. The terminal ileum is normal in appearance. No other
areas of bowel wall thickening identified. Normal appendix
visualized.

Vascular/Lymphatic: No pathologically enlarged lymph nodes. No
abdominal aortic aneurysm.

Reproductive:  No mass or other significant abnormality.

Other:  None.

Musculoskeletal:  No suspicious bone lesions identified.
IMPRESSION: Negative. No evidence of active inflammatory bowel disease or other
significant abnormality.

## 2023-05-19 ENCOUNTER — Other Ambulatory Visit: Payer: Self-pay | Admitting: Physician Assistant

## 2023-05-25 LAB — DERMATOLOGY PATHOLOGY

## 2024-04-03 ENCOUNTER — Other Ambulatory Visit: Payer: Self-pay

## 2024-04-05 ENCOUNTER — Other Ambulatory Visit: Payer: Self-pay

## 2024-04-05 ENCOUNTER — Other Ambulatory Visit (HOSPITAL_COMMUNITY): Payer: Self-pay

## 2024-04-05 MED ORDER — METHYLPHENIDATE HCL ER (OSM) 36 MG PO TBCR
72.0000 mg | EXTENDED_RELEASE_TABLET | Freq: Every morning | ORAL | 0 refills | Status: AC
  Filled 2024-04-05 (×4): qty 60, 30d supply, fill #0

## 2024-04-10 ENCOUNTER — Other Ambulatory Visit (HOSPITAL_COMMUNITY): Payer: Self-pay

## 2024-04-10 ENCOUNTER — Other Ambulatory Visit: Payer: Self-pay

## 2024-04-10 MED ORDER — METHOCARBAMOL 500 MG PO TABS
500.0000 mg | ORAL_TABLET | Freq: Three times a day (TID) | ORAL | 0 refills | Status: DC
  Filled 2024-04-10: qty 12, 4d supply, fill #0

## 2024-04-10 MED ORDER — ONDANSETRON 4 MG PO TBDP
4.0000 mg | ORAL_TABLET | Freq: Four times a day (QID) | ORAL | 0 refills | Status: DC | PRN
  Filled 2024-04-10: qty 20, 5d supply, fill #0

## 2024-04-10 MED ORDER — PROMETHAZINE HCL 25 MG RE SUPP
25.0000 mg | Freq: Four times a day (QID) | RECTAL | 0 refills | Status: DC | PRN
  Filled 2024-04-10: qty 12, 3d supply, fill #0

## 2024-04-10 MED ORDER — OXYCODONE HCL 5 MG PO TABS
5.0000 mg | ORAL_TABLET | Freq: Three times a day (TID) | ORAL | 0 refills | Status: DC | PRN
  Filled 2024-04-10: qty 12, 4d supply, fill #0

## 2024-04-10 NOTE — Discharge Summary (Signed)
 [facility]  [facility] Inpatient Discharge Summary  PCP: ALFRIEDA DASEN TU Discharge Details   Admit date:         04/09/2024 Discharge date:        04/10/2024  Hospital Days:    1 days  Code Status:   Full Code Advanced Directives on file: No Directive        Discharge Diagnoses:  Acute cholecystitis  Unresulted Labs     Order Current Status   Pathology Tissue Request Collected (04/10/24 1556)       Follow-Up Appointments Suggested: Your primary care doctor in 3 days if not better.     Follow-Up Appointments Already Scheduled: No future appointments.  Discharge Medications:   Current Discharge Medication List     START taking these medications      Details  methocarbamol  500 mg tablet Commonly known as: ROBAXIN   Take one tablet (500 mg dose) by mouth 3 (three) times a day for 4 days. Quantity: 12 tablet   ondansetron  4 mg disintegrating tablet Commonly known as: ZOFRAN -ODT  Take one tablet (4 mg dose) by mouth every 6 (six) hours as needed. Quantity: 20 tablet   oxyCODONE  HCl 5 mg immediate release tablet Commonly known as: ROXICODONE   Take one tablet (5 mg dose) by mouth every 8 (eight) hours as needed for up to 4 days. Max Daily Amount: 15 mg Quantity: 12 tablet       CONTINUE these medications which have NOT CHANGED      Details  cholecalciferol 1,000 units (25 mcg) tablet Commonly known as: VITAMIN D-3  Take one tablet (1,000 Units dose) by mouth daily.   diphenhydrAMINE  25 mg tablet Commonly known as: BANOPHEN   Take one tablet (25 mg dose) by mouth every 6 (six) hours as needed for Itching.   escitalopram oxalate 10 mg tablet Commonly known as: LEXAPRO  Take one and a half tablets (15 mg dose) by mouth daily.   ibgard 90 MG capsule Commonly known as: IBGARD  Take one capsule by mouth daily.   methylphenidate  HCl 36 mg CR tablet Commonly known as: CONCERTA   Take two tablets (72 mg dose) by mouth every  morning.   MULTIPLE VITAMINS/WOMENS tablet  Take one tablet by mouth daily.   omeprazole 40 mg capsule Commonly known as: PRILOSEC  Take one capsule (40 mg dose) by mouth every morning.   vitamin B-12 1000 mcg tablet Commonly known as: CYANOCOBALAMIN  Take one tablet (1,000 mcg dose) by mouth daily.      * You might also be taking other medications not listed above. If you have questions about any of your other medications, talk to the person who prescribed them or your Primary Care Provider.           Allergies: Allergies[1]  Consultations this Admission: IP CONSULT TO GENERAL SURGERY  Procedures/Imaging: Procedures (LRB): CHOLECYSTECTOMY ROBOTIC (N/A)    CT Abdomen Pelvis W IV Contrast  Final Result  IMPRESSION:     1. Findings suspicious for acute cholecystitis.  2. Mild distal colitis versus under distention of the colon.    Electronically Signed by: Dorn Burkes on 04/09/2024 11:25 PM      Pertinent Labs:  Cardiac Labs: No results for input(s): CK, CKMB, CTNI, BNP in the last 168 hours. CBC: Recent Labs    Units 04/09/24 2057  WBC thou/mcL 9.0  HGB gm/dL 86.9  PLT thou/mcL 631   BMP: Recent Labs    Units 04/09/24 2057  NA mmol/L 138  K mmol/L 4.3  CL mmol/L 103  CO2 mmol/L 26  BUN mg/dL 15  CREATININE mg/dL 9.22   Lipid Panel: No results for input(s): CHOL, TRIG, HDL, LDL in the last 168 hours. Liver Enzymes: Recent Labs    Units 04/09/24 2057  AST U/L 24  ALT U/L 21  ALKPHOS U/L 86  BILITOT mg/dL 0.2   Endocrine Panels: Recent Labs    Units 04/09/24 2057  GLUCOSE mg/dL [id number] Course   Physicians involved in care during this hospitalization Attending Provider: Prentice Left, MD Attending Provider: Christell VEAR Mater, MD Admitting Provider: Christell VEAR Mater, MD Consulting Physician: Christell VEAR Mater, MD Anesthesiologist: Vinie Dallas Moose, MD   Hospital Course:  Audrey Malone is a 33 y.o. female  who was admitted to the General Surgery service on 04/09/24 with acute cholecystitis.  She went for the above stated procedure on 12/17 with Dr. Johnie.  There were no intra-operative complications, and the patient did well overall from a surgical standpoint. On the day of discharge, the patient reports doing well: pain is adequately controlled, denies nausea, vomiting, fever or chills.  Tolerating PO intake and ambulating well.  She feels ready to go home today. On physical exam, the patient's abdomen is soft, non-distended, appropriately tender to palpation, and incisions are clean, dry, and intact without evidence of infection.  Post-op instructions were discussed. We will plan for discharge today with follow-up in 2 weeks for post-op visit.    BP (!) 101/56 (BP Location: Right Upper Arm)   Pulse 88   Temp 98.6 F (37 C) (Oral)   Resp 18   Ht 5' 5 (1.651 m)   Wt 185 lb (83.9 kg)   LMP 03/25/2024 (Approximate)   SpO2 95%   BMI 30.79 kg/m     Post Hospital Care   Activity:   Weight Bearing Status:          Oxygen Orders for Discharge: O2 Device: None (Room air) SpO2: 95 %  Diet: Diet and Nourishment Orders (From admission, onward)     Start       04/10/24 0502  NPO Effective Now  Diet effective now                   I spent 20 minutes performing discharge services.   Electronically signed: Laymon FORBES Gentry, PA-C 04/10/2024 / 4:13 PM       [1] No Known Allergies *Some images could not be shown.

## 2024-04-10 NOTE — Anesthesia Postprocedure Evaluation (Signed)
" °  Patient: Audrey Malone Procedures: CHOLECYSTECTOMY ROBOTIC  Anesthesia type: general  Anesthesia Post Evaluation  Patient location during evaluation: PACU Patient participation: complete - patient participated Level of consciousness: awake and alert Pain management: adequate Airway patency: patent Cardiovascular status: acceptable Respiratory status: acceptable Hydration status: acceptable Vitals: stable Temperature: temperature adequate >96.56F PONV: nausea and vomiting controlled Regional Anesthesia: no block performed    BP: 100/60 (04/10/24 1630) Heart Rate: 103 (04/10/24 1616) Resp: 16 (04/10/24 1616) Temp: 98.2 F (36.8 C) (04/10/24 1616) SpO2: 99 % (04/10/24 1630) Weight: 83.9 kg (185 lb) (04/09/24 2020) BMI (Calculated): 30.8 (04/09/24 2020)     Notable Events:    No Anesthesia notable events documented.  "

## 2024-04-11 ENCOUNTER — Other Ambulatory Visit (HOSPITAL_COMMUNITY): Payer: Self-pay

## 2024-04-21 ENCOUNTER — Inpatient Hospital Stay (HOSPITAL_COMMUNITY)
Admission: EM | Admit: 2024-04-21 | Discharge: 2024-04-25 | DRG: 444 | Disposition: A | Discharge status: Home / Self Care | Payer: Self-pay | Attending: Internal Medicine | Admitting: Internal Medicine

## 2024-04-21 ENCOUNTER — Other Ambulatory Visit: Payer: Self-pay

## 2024-04-21 ENCOUNTER — Encounter (HOSPITAL_COMMUNITY): Payer: Self-pay

## 2024-04-21 ENCOUNTER — Emergency Department (HOSPITAL_COMMUNITY): Payer: Self-pay

## 2024-04-21 DIAGNOSIS — K915 Postcholecystectomy syndrome: Principal | ICD-10-CM | POA: Diagnosis present

## 2024-04-21 DIAGNOSIS — R112 Nausea with vomiting, unspecified: Secondary | ICD-10-CM | POA: Diagnosis present

## 2024-04-21 DIAGNOSIS — Z9049 Acquired absence of other specified parts of digestive tract: Secondary | ICD-10-CM

## 2024-04-21 DIAGNOSIS — Z8249 Family history of ischemic heart disease and other diseases of the circulatory system: Secondary | ICD-10-CM

## 2024-04-21 DIAGNOSIS — R8281 Pyuria: Secondary | ICD-10-CM | POA: Diagnosis present

## 2024-04-21 DIAGNOSIS — F988 Other specified behavioral and emotional disorders with onset usually occurring in childhood and adolescence: Secondary | ICD-10-CM | POA: Diagnosis present

## 2024-04-21 DIAGNOSIS — R7401 Elevation of levels of liver transaminase levels: Secondary | ICD-10-CM | POA: Diagnosis present

## 2024-04-21 DIAGNOSIS — K859 Acute pancreatitis without necrosis or infection, unspecified: Principal | ICD-10-CM | POA: Diagnosis present

## 2024-04-21 DIAGNOSIS — Z79899 Other long term (current) drug therapy: Secondary | ICD-10-CM

## 2024-04-21 DIAGNOSIS — R7989 Other specified abnormal findings of blood chemistry: Secondary | ICD-10-CM

## 2024-04-21 DIAGNOSIS — K91872 Postprocedural seroma of a digestive system organ or structure following a digestive system procedure: Secondary | ICD-10-CM | POA: Diagnosis present

## 2024-04-21 DIAGNOSIS — R1013 Epigastric pain: Principal | ICD-10-CM | POA: Diagnosis present

## 2024-04-21 DIAGNOSIS — R109 Unspecified abdominal pain: Secondary | ICD-10-CM | POA: Diagnosis present

## 2024-04-21 LAB — CBC
HCT: 42.7 % (ref 36.0–46.0)
Hemoglobin: 14.2 g/dL (ref 12.0–15.0)
MCH: 31.6 pg (ref 26.0–34.0)
MCHC: 33.3 g/dL (ref 30.0–36.0)
MCV: 94.9 fL (ref 80.0–100.0)
Platelets: 406 K/uL — ABNORMAL HIGH (ref 150–400)
RBC: 4.5 MIL/uL (ref 3.87–5.11)
RDW: 14.2 % (ref 11.5–15.5)
WBC: 20.2 K/uL — ABNORMAL HIGH (ref 4.0–10.5)
nRBC: 0 % (ref 0.0–0.2)

## 2024-04-21 LAB — COMPREHENSIVE METABOLIC PANEL WITH GFR
ALT: 954 U/L — ABNORMAL HIGH (ref 0–44)
AST: 856 U/L — ABNORMAL HIGH (ref 15–41)
Albumin: 4.2 g/dL (ref 3.5–5.0)
Alkaline Phosphatase: 412 U/L — ABNORMAL HIGH (ref 38–126)
Anion gap: 11 (ref 5–15)
BUN: 14 mg/dL (ref 6–20)
CO2: 25 mmol/L (ref 22–32)
Calcium: 9.4 mg/dL (ref 8.9–10.3)
Chloride: 106 mmol/L (ref 98–111)
Creatinine, Ser: 0.95 mg/dL (ref 0.44–1.00)
GFR, Estimated: >60 mL/min
Glucose, Bld: 114 mg/dL — ABNORMAL HIGH (ref 70–99)
Potassium: 4.1 mmol/L (ref 3.5–5.1)
Sodium: 142 mmol/L (ref 135–145)
Total Bilirubin: 3.3 mg/dL — ABNORMAL HIGH (ref 0.0–1.2)
Total Protein: 8.1 g/dL (ref 6.5–8.1)

## 2024-04-21 LAB — URINALYSIS, ROUTINE W REFLEX MICROSCOPIC
Glucose, UA: NEGATIVE mg/dL
Ketones, ur: NEGATIVE mg/dL
Nitrite: NEGATIVE
Protein, ur: 30 mg/dL — AB
RBC / HPF: >50 RBC/hpf (ref 0–5)
Specific Gravity, Urine: 1.018 (ref 1.005–1.030)
pH: 5 (ref 5.0–8.0)

## 2024-04-21 LAB — LIPASE, BLOOD: Lipase: >2800 U/L — ABNORMAL HIGH (ref 11–51)

## 2024-04-21 LAB — HCG, SERUM, QUALITATIVE: Preg, Serum: NEGATIVE

## 2024-04-21 MED ORDER — IOHEXOL 350 MG/ML SOLN
75.0000 mL | Freq: Once | INTRAVENOUS | Status: AC | PRN
  Administered 2024-04-21: 75 mL via INTRAVENOUS

## 2024-04-21 MED ORDER — MORPHINE SULFATE (PF) 4 MG/ML IV SOLN
4.0000 mg | Freq: Once | INTRAVENOUS | Status: AC
  Administered 2024-04-21: 4 mg via INTRAVENOUS
  Filled 2024-04-21: qty 1

## 2024-04-21 MED ORDER — LACTATED RINGERS IV BOLUS
1000.0000 mL | Freq: Once | INTRAVENOUS | Status: AC
  Administered 2024-04-22: 1000 mL via INTRAVENOUS

## 2024-04-21 MED ORDER — ONDANSETRON HCL 4 MG/2ML IJ SOLN
4.0000 mg | Freq: Once | INTRAMUSCULAR | Status: AC
  Administered 2024-04-21: 4 mg via INTRAVENOUS
  Filled 2024-04-21: qty 2

## 2024-04-21 NOTE — ED Provider Triage Note (Signed)
 Emergency Medicine Provider Triage Evaluation Note  Audrey Malone , a 33 y.o. female  was evaluated in triage.  Pt complains of epigastric and left sided abd pain since gallbladder surgery 12/19, but worse in past day, and today with episodes of nv. Last bm two days ago. No fever/chills. No dysuria or gu c/o. No vaginal discharge or bleeding.   Review of Systems  Positive: Abd pain, nv.  Negative: Fever.   Physical Exam  BP (!) 119/94 (BP Location: Right Arm)   Pulse 80   Temp 98.5 F (36.9 C) (Oral)   Resp 16   Ht 1.651 m (5' 5)   Wt 83.9 kg   LMP 04/19/2024   SpO2 97%   BMI 30.79 kg/m  Gen:   Awake, no distress   Resp:  Normal effort  MSK:   Soft, epigastric and left abdominal tenderness. No incarcerated hernia felt. No peritoneal signs. Healing surgical scars without acute infection.    Medical Decision Making  Medically screening exam initiated at 9:04 PM.  Appropriate orders placed.  Oree Mirelez was informed that the remainder of the evaluation will be completed by another provider, this initial triage assessment does not replace that evaluation, and the importance of remaining in the ED until their evaluation is complete.  Labs and imaging ordered.    Bernard Drivers, MD 04/21/24 2105

## 2024-04-21 NOTE — ED Notes (Signed)
 Patient transported to CT

## 2024-04-21 NOTE — ED Notes (Signed)
 Pt to ED via [facility] from home c/o abdominal x 2 days, LLQ-  Gallbladder removal on 12/19,  reports two episodes of dark red emesis today   No medications given by EMS  Last VS: 116/76, hr 74, RR18, 98%ra, cbg 126

## 2024-04-21 NOTE — ED Notes (Signed)
 Pt to CT

## 2024-04-22 ENCOUNTER — Observation Stay (HOSPITAL_COMMUNITY): Payer: Self-pay

## 2024-04-22 ENCOUNTER — Encounter (HOSPITAL_COMMUNITY): Payer: Self-pay | Admitting: Internal Medicine

## 2024-04-22 DIAGNOSIS — F988 Other specified behavioral and emotional disorders with onset usually occurring in childhood and adolescence: Secondary | ICD-10-CM | POA: Diagnosis not present

## 2024-04-22 DIAGNOSIS — R112 Nausea with vomiting, unspecified: Secondary | ICD-10-CM | POA: Diagnosis not present

## 2024-04-22 DIAGNOSIS — R1013 Epigastric pain: Principal | ICD-10-CM | POA: Diagnosis present

## 2024-04-22 DIAGNOSIS — R8281 Pyuria: Secondary | ICD-10-CM | POA: Diagnosis present

## 2024-04-22 DIAGNOSIS — R7401 Elevation of levels of liver transaminase levels: Secondary | ICD-10-CM | POA: Diagnosis present

## 2024-04-22 DIAGNOSIS — K859 Acute pancreatitis without necrosis or infection, unspecified: Secondary | ICD-10-CM | POA: Diagnosis present

## 2024-04-22 DIAGNOSIS — R109 Unspecified abdominal pain: Secondary | ICD-10-CM | POA: Diagnosis present

## 2024-04-22 LAB — CBC WITH DIFFERENTIAL/PLATELET
Abs Immature Granulocytes: 0.05 K/uL (ref 0.00–0.07)
Basophils Absolute: 0 K/uL (ref 0.0–0.1)
Basophils Relative: 0 %
Eosinophils Absolute: 0 K/uL (ref 0.0–0.5)
Eosinophils Relative: 0 %
HCT: 37.8 % (ref 36.0–46.0)
Hemoglobin: 12.7 g/dL (ref 12.0–15.0)
Immature Granulocytes: 0 %
Lymphocytes Relative: 6 %
Lymphs Abs: 0.8 K/uL (ref 0.7–4.0)
MCH: 31.6 pg (ref 26.0–34.0)
MCHC: 33.6 g/dL (ref 30.0–36.0)
MCV: 94 fL (ref 80.0–100.0)
Monocytes Absolute: 0.6 K/uL (ref 0.1–1.0)
Monocytes Relative: 4 %
Neutro Abs: 11.8 K/uL — ABNORMAL HIGH (ref 1.7–7.7)
Neutrophils Relative %: 90 %
Platelets: 364 K/uL (ref 150–400)
RBC: 4.02 MIL/uL (ref 3.87–5.11)
RDW: 14.1 % (ref 11.5–15.5)
WBC: 13.2 K/uL — ABNORMAL HIGH (ref 4.0–10.5)
nRBC: 0 % (ref 0.0–0.2)

## 2024-04-22 LAB — COMPREHENSIVE METABOLIC PANEL WITH GFR
ALT: 800 U/L — ABNORMAL HIGH (ref 0–44)
AST: 613 U/L — ABNORMAL HIGH (ref 15–41)
Albumin: 3.8 g/dL (ref 3.5–5.0)
Alkaline Phosphatase: 395 U/L — ABNORMAL HIGH (ref 38–126)
Anion gap: 11 (ref 5–15)
BUN: 14 mg/dL (ref 6–20)
CO2: 24 mmol/L (ref 22–32)
Calcium: 9 mg/dL (ref 8.9–10.3)
Chloride: 107 mmol/L (ref 98–111)
Creatinine, Ser: 0.79 mg/dL (ref 0.44–1.00)
GFR, Estimated: >60 mL/min
Glucose, Bld: 150 mg/dL — ABNORMAL HIGH (ref 70–99)
Potassium: 4.1 mmol/L (ref 3.5–5.1)
Sodium: 142 mmol/L (ref 135–145)
Total Bilirubin: 2.3 mg/dL — ABNORMAL HIGH (ref 0.0–1.2)
Total Protein: 7.1 g/dL (ref 6.5–8.1)

## 2024-04-22 LAB — LIPASE, BLOOD: Lipase: >2800 U/L — ABNORMAL HIGH (ref 11–51)

## 2024-04-22 LAB — GAMMA GT: GGT: 388 U/L — ABNORMAL HIGH (ref 7–50)

## 2024-04-22 LAB — BILIRUBIN, DIRECT
Bilirubin, Direct: 2.3 mg/dL — ABNORMAL HIGH (ref 0.0–0.2)
Bilirubin, Direct: 1.7 mg/dL — ABNORMAL HIGH (ref 0.0–0.2)

## 2024-04-22 LAB — PROTIME-INR
INR: 1.1 (ref 0.8–1.2)
Prothrombin Time: 14.4 s (ref 11.4–15.2)

## 2024-04-22 LAB — MAGNESIUM: Magnesium: 1.7 mg/dL (ref 1.7–2.4)

## 2024-04-22 LAB — APTT: aPTT: 24 s (ref 24–36)

## 2024-04-22 MED ORDER — LACTATED RINGERS IV BOLUS
1000.0000 mL | Freq: Once | INTRAVENOUS | Status: AC
  Administered 2024-04-22: 1000 mL via INTRAVENOUS

## 2024-04-22 MED ORDER — GADOBUTROL 1 MMOL/ML IV SOLN
8.0000 mL | Freq: Once | INTRAVENOUS | Status: AC | PRN
  Administered 2024-04-22: 8 mL via INTRAVENOUS

## 2024-04-22 MED ORDER — OXYCODONE HCL 5 MG PO TABS: 5.0000 mg | ORAL_TABLET | Freq: Four times a day (QID) | ORAL | Status: DC | PRN

## 2024-04-22 MED ORDER — SODIUM CHLORIDE 0.9 % IV SOLN
12.5000 mg | Freq: Four times a day (QID) | INTRAVENOUS | Status: DC | PRN
  Administered 2024-04-24: 12.5 mg via INTRAVENOUS
  Filled 2024-04-22: qty 12.5

## 2024-04-22 MED ORDER — PIPERACILLIN-TAZOBACTAM 3.375 G IVPB 30 MIN
3.3750 g | Freq: Once | INTRAVENOUS | Status: AC
  Administered 2024-04-22: 3.375 g via INTRAVENOUS
  Filled 2024-04-22: qty 50

## 2024-04-22 MED ORDER — ONDANSETRON HCL 4 MG/2ML IJ SOLN
4.0000 mg | Freq: Once | INTRAMUSCULAR | Status: AC
  Administered 2024-04-22: 4 mg via INTRAVENOUS
  Filled 2024-04-22: qty 2

## 2024-04-22 MED ORDER — HYDROMORPHONE HCL 1 MG/ML IJ SOLN
0.5000 mg | INTRAMUSCULAR | Status: DC | PRN
  Administered 2024-04-22 – 2024-04-24 (×6): 0.5 mg via INTRAVENOUS
  Filled 2024-04-22 (×6): qty 0.5

## 2024-04-22 MED ORDER — ACETAMINOPHEN 325 MG PO TABS
650.0000 mg | ORAL_TABLET | Freq: Four times a day (QID) | ORAL | Status: DC | PRN
  Administered 2024-04-24: 650 mg via ORAL
  Filled 2024-04-22: qty 2

## 2024-04-22 MED ORDER — PANTOPRAZOLE SODIUM 40 MG IV SOLR
40.0000 mg | Freq: Once | INTRAVENOUS | Status: AC
  Administered 2024-04-22: 40 mg via INTRAVENOUS
  Filled 2024-04-22: qty 10

## 2024-04-22 MED ORDER — PIPERACILLIN-TAZOBACTAM 3.375 G IVPB
3.3750 g | Freq: Three times a day (TID) | INTRAVENOUS | Status: DC
  Administered 2024-04-22 – 2024-04-23 (×3): 3.375 g via INTRAVENOUS
  Filled 2024-04-22 (×3): qty 50

## 2024-04-22 MED ORDER — INFLUENZA VIRUS VACC SPLIT PF (FLUZONE) 0.5 ML IM SUSY
0.5000 mL | PREFILLED_SYRINGE | INTRAMUSCULAR | Status: DC
  Filled 2024-04-22: qty 0.5

## 2024-04-22 MED ORDER — SODIUM CHLORIDE 0.9 % IV SOLN
1.0000 g | Freq: Once | INTRAVENOUS | Status: DC
  Filled 2024-04-22: qty 10

## 2024-04-22 MED ORDER — ACETAMINOPHEN 650 MG RE SUPP: 650.0000 mg | Freq: Four times a day (QID) | RECTAL | Status: DC | PRN

## 2024-04-22 MED ORDER — LACTATED RINGERS IV SOLN: INTRAVENOUS | Status: AC

## 2024-04-22 MED ORDER — METOCLOPRAMIDE HCL 5 MG/ML IJ SOLN
10.0000 mg | Freq: Once | INTRAMUSCULAR | Status: AC
  Administered 2024-04-22: 10 mg via INTRAVENOUS
  Filled 2024-04-22: qty 2

## 2024-04-22 MED ORDER — HYDROMORPHONE HCL 1 MG/ML IJ SOLN
1.0000 mg | INTRAMUSCULAR | Status: DC | PRN
  Administered 2024-04-22: 1 mg via INTRAVENOUS
  Filled 2024-04-22: qty 1

## 2024-04-22 MED ORDER — FAMOTIDINE IN NACL 20-0.9 MG/50ML-% IV SOLN
20.0000 mg | Freq: Once | INTRAVENOUS | Status: AC
  Administered 2024-04-22: 20 mg via INTRAVENOUS
  Filled 2024-04-22: qty 50

## 2024-04-22 MED ORDER — LACTATED RINGERS IV SOLN: INTRAVENOUS | Status: DC

## 2024-04-22 MED ORDER — ONDANSETRON HCL 4 MG/2ML IJ SOLN
4.0000 mg | Freq: Four times a day (QID) | INTRAMUSCULAR | Status: DC | PRN
  Administered 2024-04-22 – 2024-04-24 (×5): 4 mg via INTRAVENOUS
  Filled 2024-04-22 (×6): qty 2

## 2024-04-22 MED ORDER — DIPHENHYDRAMINE HCL 50 MG/ML IJ SOLN
12.5000 mg | Freq: Once | INTRAMUSCULAR | Status: AC
  Administered 2024-04-22: 12.5 mg via INTRAVENOUS
  Filled 2024-04-22: qty 1

## 2024-04-22 MED ORDER — PANTOPRAZOLE SODIUM 40 MG IV SOLR
40.0000 mg | INTRAVENOUS | Status: DC
  Administered 2024-04-22 – 2024-04-24 (×3): 40 mg via INTRAVENOUS
  Filled 2024-04-22 (×3): qty 10

## 2024-04-22 NOTE — Progress Notes (Signed)
 "        [facility]                                                                               Audrey Malone, is a 33 y.o. female, DOB - 09-20-1990, FMW:[id number] Admit date - 04/21/2024    Outpatient Primary MD for the patient is Tu, Ching T, DO  LOS - 0  days    Brief summary   Audrey Malone is a 33 y.o. female with medical history significant for ADD, laparoscopic cholecystectomy on 04/10/2024, who is admitted to [facility] on 04/21/2024 with acute epigastric pain after presenting from home to [facility] ED complaining of abdominal pain.    Assessment & Plan    Assessment and Plan:   Patient admitted earlier this morning   Acute pancreatitis probably secondary to choledocholithiasis that passed Recommend bowel rest, IV fluids IV pain control.  Appreciate gen surgery consult.    Elevated liver enzymes  Possibly from acute pancreatitis.   Estimated body mass index is 30.79 kg/m as calculated from the following:   Height as of this encounter: 5' 5 (1.651 m).   Weight as of this encounter: 83.9 kg.  Code Status: full code.  DVT Prophylaxis:  SCDs Start: 04/22/24 0306   Level of Care: Level of care: Telemetry Family Communication: none at bedside.   Disposition Plan:     Remains inpatient appropriate:  pending clinical improvement.    Procedures:  MRCP.   Consultants:   General surgery.   Antimicrobials:   Anti-infectives (From admission, onward)    Start     Dose/Rate Route Frequency Ordered Stop   04/22/24 1000  piperacillin -tazobactam (ZOSYN ) IVPB 3.375 g        3.375 g 12.5 mL/hr over 240 Minutes Intravenous Every 8 hours 04/22/24 0354     04/22/24 0145  piperacillin -tazobactam (ZOSYN ) IVPB 3.375 g        3.375 g 100 mL/hr over 30 Minutes Intravenous  Once 04/22/24 0136 04/22/24 0237   04/22/24 0115  cefTRIAXone (ROCEPHIN) 1 g in sodium chloride  0.9 % 100 mL IVPB  Status:  Discontinued        1 g 200 mL/hr over 30 Minutes Intravenous   Once 04/22/24 0106 04/22/24 0120        Medications  Scheduled Meds:  pantoprazole  (PROTONIX ) IV  40 mg Intravenous Q24H   Continuous Infusions:  lactated ringers  125 mL/hr at 04/22/24 0346   piperacillin -tazobactam (ZOSYN )  IV 3.375 g (04/22/24 0932)   promethazine  (PHENERGAN ) injection (IM or IVPB)     PRN Meds:.acetaminophen  **OR** acetaminophen , HYDROmorphone  (DILAUDID ) injection, ondansetron  (ZOFRAN ) IV, oxyCODONE , promethazine  (PHENERGAN ) injection (IM or IVPB)    Subjective:   Audrey Malone was seen and examined today.  Pain is slowly improving.   Objective:   Vitals:   04/22/24 0700 04/22/24 0745 04/22/24 0915 04/22/24 1045  BP: 92/66 91/64 (!) 88/54   Pulse: 69 67 84   Resp: 18  17   Temp: 97.8 F (36.6 C)   97.7 F (36.5 C)  TempSrc: Oral   Oral  SpO2: 98% 100% 100%   Weight:      Height:  Intake/Output Summary (Last 24 hours) at 04/22/2024 1159 Last data filed at 04/22/2024 0803 Gross per 24 hour  Intake 1000 ml  Output --  Net 1000 ml   Filed Weights   04/21/24 2040  Weight: 83.9 kg     Exam General: Alert and oriented x 3, NAD Cardiovascular: S1 S2 auscultated, no murmurs, RRR Respiratory: Clear to auscultation bilaterally, no wheezing, rales or rhonchi Gastrointestinal: Soft generalized  tenderness. Bs+ Ext: no pedal edema bilaterally Neuro: AAOx3,   Data Reviewed:  I have personally reviewed following labs and imaging studies   CBC Lab Results  Component Value Date   WBC 13.2 (H) 04/22/2024   RBC 4.02 04/22/2024   HGB 12.7 04/22/2024   HCT 37.8 04/22/2024   MCV 94.0 04/22/2024   MCH 31.6 04/22/2024   PLT 364 04/22/2024   MCHC 33.6 04/22/2024   RDW 14.1 04/22/2024   LYMPHSABS 0.8 04/22/2024   MONOABS 0.6 04/22/2024   EOSABS 0.0 04/22/2024   BASOSABS 0.0 04/22/2024     Last metabolic panel Lab Results  Component Value Date   NA 142 04/22/2024   K 4.1 04/22/2024   CL 107 04/22/2024   CO2 24 04/22/2024   BUN 14  04/22/2024   CREATININE 0.79 04/22/2024   GLUCOSE 150 (H) 04/22/2024   GFRNONAA >60 04/22/2024   GFRAA >60 05/13/2017   CALCIUM 9.0 04/22/2024   PROT 7.1 04/22/2024   ALBUMIN 3.8 04/22/2024   BILITOT 2.3 (H) 04/22/2024   ALKPHOS 395 (H) 04/22/2024   AST 613 (H) 04/22/2024   ALT 800 (H) 04/22/2024   ANIONGAP 11 04/22/2024    CBG (last 3)  No results for input(s): GLUCAP in the last 72 hours.    Coagulation Profile: Recent Labs  Lab 04/22/24 0437  INR 1.1     Radiology Studies: MR ABDOMEN MRCP W WO CONTAST Result Date: 04/22/2024 EXAM: MRCP WITH AND WITHOUT IV CONTRAST 04/22/2024 05:34:00 AM TECHNIQUE: Multisequence, multiplanar magnetic resonance images of the abdomen with and without intravenous contrast. MRCP sequences were performed. 8 mL gadobutrol  (GADAVIST ) 1 MMOL/ML injection 8 mL GADOBUTROL  1 MMOL/ML IV SOLN. COMPARISON: 04/21/2024 status post cholecystectomy. CLINICAL HISTORY: Pancreatitis suspected; Evaluate for choledocholithiasis, status post cholecystectomy. FINDINGS: LIVER: Unremarkable. GALLBLADDER AND BILIARY SYSTEM: Status post cholecystectomy. Common bile duct measures up to 3 mm in maximum diameter. No signs of choledocholithiasis. A short segment of luminal narrowing of the distal common bile duct just before the ampulla is noted, which may reflect a stricture, possibly due to a recently passed stone. No intrahepatic or extrahepatic ductal dilation. SPLEEN: Unremarkable. PANCREAS/PANCREATIC DUCT: There is mild diffuse edema about the pancreas compatible with no acute pancreatitis. Small volume of peripancreatic soft tissue edema and fluid noted. No signs of pancreatic necrosis or pseudocyst. No main duct dilatation or mass. ADRENAL GLANDS: Unremarkable. KIDNEYS: Unremarkable. LYMPH NODES: No enlarged abdominal lymph nodes. VASCULATURE: Unremarkable. PERITONEUM: No significant fluid collections identified. ABDOMINAL WALL: No hernia. No mass. BOWEL: Grossly  unremarkable. No bowel obstruction. BONES: No acute abnormality or worrisome osseous lesion. SOFT TISSUES: Unremarkable. MISCELLANEOUS: Unremarkable. IMPRESSION: 1. Mild diffuse pancreatic edema compatible with acute pancreatitis, with small volume peripancreatic soft tissue edema and fluid, and no pancreatic necrosis or pseudocyst. 2. Status post cholecystectomy. No common bile duct dilatation. 3. Short segment luminal narrowing of the distal common bile duct just proximal to the ampulla, which may reflect a stricture related to a recently passed stone, with no choledocholithiasis. Electronically signed by: Waddell Calk MD 04/22/2024 05:51 AM EST  RP Workstation: [id number]   MR 3D Recon At Scanner Result Date: 04/22/2024 EXAM: MRCP WITH AND WITHOUT IV CONTRAST 04/22/2024 05:34:00 AM TECHNIQUE: Multisequence, multiplanar magnetic resonance images of the abdomen with and without intravenous contrast. MRCP sequences were performed. 8 mL gadobutrol  (GADAVIST ) 1 MMOL/ML injection 8 mL GADOBUTROL  1 MMOL/ML IV SOLN. COMPARISON: 04/21/2024 status post cholecystectomy. CLINICAL HISTORY: Pancreatitis suspected; Evaluate for choledocholithiasis, status post cholecystectomy. FINDINGS: LIVER: Unremarkable. GALLBLADDER AND BILIARY SYSTEM: Status post cholecystectomy. Common bile duct measures up to 3 mm in maximum diameter. No signs of choledocholithiasis. A short segment of luminal narrowing of the distal common bile duct just before the ampulla is noted, which may reflect a stricture, possibly due to a recently passed stone. No intrahepatic or extrahepatic ductal dilation. SPLEEN: Unremarkable. PANCREAS/PANCREATIC DUCT: There is mild diffuse edema about the pancreas compatible with no acute pancreatitis. Small volume of peripancreatic soft tissue edema and fluid noted. No signs of pancreatic necrosis or pseudocyst. No main duct dilatation or mass. ADRENAL GLANDS: Unremarkable. KIDNEYS: Unremarkable. LYMPH NODES: No  enlarged abdominal lymph nodes. VASCULATURE: Unremarkable. PERITONEUM: No significant fluid collections identified. ABDOMINAL WALL: No hernia. No mass. BOWEL: Grossly unremarkable. No bowel obstruction. BONES: No acute abnormality or worrisome osseous lesion. SOFT TISSUES: Unremarkable. MISCELLANEOUS: Unremarkable. IMPRESSION: 1. Mild diffuse pancreatic edema compatible with acute pancreatitis, with small volume peripancreatic soft tissue edema and fluid, and no pancreatic necrosis or pseudocyst. 2. Status post cholecystectomy. No common bile duct dilatation. 3. Short segment luminal narrowing of the distal common bile duct just proximal to the ampulla, which may reflect a stricture related to a recently passed stone, with no choledocholithiasis. Electronically signed by: Waddell Calk MD 04/22/2024 05:51 AM EST RP Workstation: [id number]   CT ABDOMEN PELVIS W CONTRAST Result Date: 04/21/2024 EXAM: CT ABDOMEN AND PELVIS WITH CONTRAST 04/21/2024 10:47:05 PM TECHNIQUE: CT of the abdomen and pelvis was performed with the administration of 75 mL of iohexol  (OMNIPAQUE ) 350 MG/ML injection. Multiplanar reformatted images are provided for review. Automated exposure control, iterative reconstruction, and/or weight-based adjustment of the mA/kV was utilized to reduce the radiation dose to as low as reasonably achievable. COMPARISON: Comparison is made to 05/27/2017. CLINICAL HISTORY: Abdominal pain, acute, nonlocalized; Abdominal pain, post-op. Status post cholecystectomy April 12 2024 now with left lower quadrant abdominal pain and hematemesis. FINDINGS: LOWER CHEST: No acute abnormality. LIVER: The liver is unremarkable. GALLBLADDER AND BILE DUCTS: Status post cholecystectomy. Poorly circumscribed fluid collection is seen in the gallbladder fossa measuring 1.6 x 3.2 cm (series 23, image 3) also representing a postoperative hematoma or seroma. No intra or extrahepatic biliary ductal dilation. SPLEEN: No acute  abnormality. PANCREAS: No acute abnormality. ADRENAL GLANDS: No acute abnormality. KIDNEYS, URETERS AND BLADDER: No stones in the kidneys or ureters. No hydronephrosis. No perinephric or periureteral stranding. Urinary bladder is unremarkable. GI AND BOWEL: Stomach demonstrates no acute abnormality. The small and large bowel are unremarkable. Appendix normal. There is no bowel obstruction. PERITONEUM AND RETROPERITONEUM: Small free intraperitoneal gas is seen within the lower abdomen. Trace free fluid within the pelvis is nonspecific. VASCULATURE: Aorta is normal in caliber. LYMPH NODES: No lymphadenopathy. REPRODUCTIVE ORGANS: No acute abnormality. BONES AND SOFT TISSUES: No acute osseous abnormality. Punctate foci of subcutaneous gas seen within the left lower quadrant likely related to recent laparoscopic cholecystectomy. IMPRESSION: 1. Status post cholecystectomy with a 1.6 x 3.2 cm poorly circumscribed fluid collection in the gallbladder fossa, which may represent a postoperative hematoma or seroma 2. small free intraperitoneal and left lower  quadrant subcutaneous gas likely related to recent laparoscopic surgery. Electronically signed by: Dorethia Molt MD 04/21/2024 10:57 PM EST RP Workstation: [id number]       Elgie Butter M.D. [facility] 04/22/2024, 11:59 AM  Available via Epic secure chat 7am-7pm After 7 pm, please refer to night coverage provider listed on amion.    "

## 2024-04-22 NOTE — H&P (Signed)
 " History and Physical      Audrey Malone FMW:[id number] DOB: Apr 11, 1991 DOA: 04/21/2024; DOS: 04/22/2024  PCP: Ricky Alfrieda DASEN, DO  Patient coming from: home   I have personally briefly reviewed patient's old medical records in [facility]  Chief Complaint: Abdominal pain  HPI: Audrey Malone is a 33 y.o. female with medical history significant for ADD, laparoscopic cholecystectomy on 04/10/2024, who is admitted to [facility] on 04/21/2024 with acute epigastric pain after presenting from home to [facility] ED complaining of abdominal pain.   The patient underwent laparoscopic cholecystectomy on 04/10/2024 through [facility].  Subsequently, she reports that she had been experiencing an uncomplicated recovery following aforementioned laparoscopic cholecystectomy, noting improving abdominal discomfort.  However, over the last 2 days, she notes significant increase in intensity of sharp, nonradiating epigastric pain, worse with palpation, and associated with recurrent nausea resulting in at least 4-5 daily episodes of nonbloody, nonbilious emesis over that timeframe, with most recent such episode of nausea/vomiting occurring in the emergency department this evening.  She denies any associated subjective fever, chills or rigors, or generalized nauseous.  No recent dysuria.  No recent chest pain, palpitations, diaphoresis.  Per chart review, most recent prior liver enzymes were checked on 05/13/2017, and were notable for the following results at that time: Alkaline phosphatase 69, total bilirubin 0.7, AST 20, ALT 16.  Additionally, most recent prior lipase was found to be 53 when checked on January 2019.    ED Course:  Vital signs in the ED were notable for the following: Afebrile with temperature max 98.5; heart rate in the 70s to 80s; still blood pressures in the 1 teens; respiratory rate 16, oxygen saturation 96 to 97% on room air.  Labs were notable for the following: CMP was notable for the  following: Creatinine 0.95, glucose 114, alkaline phosphatase 412, total bilirubin 3.3, direct bilirubin 2.3, AST 856, ALT 954.  Initially, lipase was 2800.  CBC notable for white blood cell count 20,200, hemoglobin 14.2, platelet count 406.  Qualitative serum pregnancy was negative.  Urinalysis SOSi with cloudy appearing specimen and notable for 21-50 white blood cells, moderate leukocyte esterase.  Per my interpretation, EKG in ED demonstrated the following: No EKG performed in the ED today.  Imaging in the ED, per corresponding formal radiology read, was notable for the following: CT abdomen/pelvis with contrast showed status postcholecystectomy with a 1.6 x 3.2 cm poorly circumscribed fluid collection in the gallbladder fossa, which, per radiology report, may represent a postoperative seroma or hematoma, without any evidence to suggest abscess.  Additionally, this imagings showed small free intraperitoneal and left lower quadrant subcutaneous gas likely related to recent laparoscopic surgery.  Additionally, CT abdomen/pelvis showed no evidence of acute pancreatic process, including no evidence of pancreatic inflammation or peripancreatic fluid collection, while also showing no evidence of biliary dilation nor any evidence of choledocholithiasis.  His imaging also showed no evidence of bowel obstruction, abscess, or perforation, and no evidence of acute hepatic pathology.  EDP discussed patient's case with on-call general surgery, Dr. Signe, who recommended hospitalist admission and conveyed that general surgery will consult. In the meantime, Dr. Signe recommends MRCP to evaluate for choledocholithiasis, with prn GI consult if MRCP is positive for the latter.   While in the ED, the following were administered: Benadryl  12.5 mg IV x 1 dose, Reglan  10 mg IV x 1 dose, Zofran  4 mg IV x 2 doses, morphine  4 mg IV x 1 dose, Dilaudid  1 mg IV x  1 dose, Protonix  40 mg IV x 1, Pepcid  20 mg IV x 1, Zosyn , and  lactated Ringer 's x 1 L bolus..  Subsequently, the patient was admitted for further evaluation management of presenting acute epigastric pain.      Review of Systems: As per HPI otherwise 10 point review of systems negative.   Past Medical History:  Diagnosis Date   ADD (attention deficit disorder)    Anxiety     Past Surgical History:  Procedure Laterality Date   CHOLECYSTECTOMY, LAPAROSCOPIC     on 04/10/24 at [facility]   None      Social History:  reports that she has never smoked. She has never used smokeless tobacco. She reports that she does not drink alcohol and does not use drugs.   Allergies[1]  Family History  Problem Relation Age of Onset   Factor V Leiden deficiency Father    Thyroid disease Paternal Grandfather    Cancer Maternal Grandmother        bladder   Food Allergy Mother        fish   Heart disease Maternal Grandfather    Heart attack Maternal Grandfather    Arthritis Paternal Grandmother    Hypercholesterolemia Paternal Grandmother    Colon cancer Neg Hx    Liver cancer Neg Hx    Allergic rhinitis Neg Hx    Asthma Neg Hx    Eczema Neg Hx    Urticaria Neg Hx    Angioedema Neg Hx     Family history reviewed and not pertinent    Prior to Admission medications  Medication Sig Start Date End Date Taking? Authorizing Provider  dicyclomine  (BENTYL ) 10 MG capsule Take 1 tab every 6 hours as needed for abdominal cramping 06/22/17   Esterwood, Amy S, PA-C  dicyclomine  (BENTYL ) 20 MG tablet Take 1 tablet (20 mg total) by mouth 2 (two) times daily as needed for spasms. 05/14/17   Griselda Norris, MD  methocarbamol  (ROBAXIN ) 500 MG tablet Take 1 tablet (500 mg total) by mouth 3 (three) times daily. 04/10/24     methylphenidate  36 MG PO CR tablet Take 36 mg by mouth 2 (two) times daily.    [provider]  methylphenidate  36 MG PO CR tablet Take 2 tablets (72 mg total) by mouth in the morning. 04/05/24     norethindrone-ethinyl estradiol-iron  (BLISOVI FE 1.5/30) 1.5-30 MG-MCG tablet Take 1 tablet by mouth daily.    [provider]  ondansetron  (ZOFRAN ) 4 MG tablet Take 1 tablet (4 mg total) by mouth every 8 (eight) hours as needed for nausea or vomiting. 05/14/17   Griselda Norris, MD  ondansetron  (ZOFRAN -ODT) 4 MG disintegrating tablet Take 1 tablet (4 mg total) by dissolving in the mouth every 6 (six) hours as needed. 04/09/24     oxyCODONE  (OXY IR/ROXICODONE ) 5 MG immediate release tablet Take one tablet (5 mg dose) by mouth every 8 (eight) hours as needed for up to 4 days. 04/10/24     promethazine  (PHENERGAN ) 25 MG suppository Place 1 suppository (25 mg total) rectally every 6 (six) hours as needed for nausea. 04/09/24 04/10/24       Objective    Physical Exam: Vitals:   04/21/24 2039 04/21/24 2040 04/22/24 0139  BP: (!) 119/94  111/74  Pulse: 80  71  Resp: 16  16  Temp: 98.5 F (36.9 C)  98.2 F (36.8 C)  TempSrc: Oral  Oral  SpO2: 97%  96%  Weight:  83.9 kg  Height:  5' 5 (1.651 m)     General: appears to be stated age; alert, oriented Skin: warm, dry, no rash Head:  AT/[place] Mouth:  Oral mucosa membranes appear dry, normal dentition Neck: supple; trachea midline Heart:  RRR; did not appreciate any M/R/G Lungs: CTAB, did not appreciate any wheezes, rales, or rhonchi Abdomen: + BS; soft, tenderness to palpation over the epigastrium, in the absence of guarding, or rigidity, or rebound tenderness Vascular: 2+ pedal pulses b/l; 2+ radial pulses b/l Extremities: no peripheral edema, no muscle wasting         Labs on Admission: I have personally reviewed following labs and imaging studies  CBC: Recent Labs  Lab 04/21/24 2053  WBC 20.2*  HGB 14.2  HCT 42.7  MCV 94.9  PLT 406*   Basic Metabolic Panel: Recent Labs  Lab 04/21/24 2053  NA 142  K 4.1  CL 106  CO2 25  GLUCOSE 114*  BUN 14  CREATININE 0.95  CALCIUM 9.4   GFR: Estimated Creatinine Clearance: 91.8 mL/min (by C-G formula  based on SCr of 0.95 mg/dL). Liver Function Tests: Recent Labs  Lab 04/21/24 2053  AST 856*  ALT 954*  ALKPHOS 412*  BILITOT 3.3*  PROT 8.1  ALBUMIN 4.2   Recent Labs  Lab 04/21/24 2053  LIPASE >2,800*   No results for input(s): AMMONIA in the last 168 hours. Coagulation Profile: No results for input(s): INR, PROTIME in the last 168 hours. Cardiac Enzymes: No results for input(s): CKTOTAL, CKMB, CKMBINDEX, TROPONINI in the last 168 hours. BNP (last 3 results) No results for input(s): PROBNP in the last 8760 hours. HbA1C: No results for input(s): HGBA1C in the last 72 hours. CBG: No results for input(s): GLUCAP in the last 168 hours. Lipid Profile: No results for input(s): CHOL, HDL, LDLCALC, TRIG, CHOLHDL, LDLDIRECT in the last 72 hours. Thyroid Function Tests: No results for input(s): TSH, T4TOTAL, FREET4, T3FREE, THYROIDAB in the last 72 hours. Anemia Panel: No results for input(s): VITAMINB12, FOLATE, FERRITIN, TIBC, IRON, RETICCTPCT in the last 72 hours. Urine analysis:    Component Value Date/Time   COLORURINE AMBER (A) 04/21/2024 2102   APPEARANCEUR CLOUDY (A) 04/21/2024 2102   LABSPEC 1.018 04/21/2024 2102   PHURINE 5.0 04/21/2024 2102   GLUCOSEU NEGATIVE 04/21/2024 2102   HGBUR LARGE (A) 04/21/2024 2102   BILIRUBINUR SMALL (A) 04/21/2024 2102   KETONESUR NEGATIVE 04/21/2024 2102   PROTEINUR 30 (A) 04/21/2024 2102   NITRITE NEGATIVE 04/21/2024 2102   LEUKOCYTESUR MODERATE (A) 04/21/2024 2102    Radiological Exams on Admission: CT ABDOMEN PELVIS W CONTRAST Result Date: 04/21/2024 EXAM: CT ABDOMEN AND PELVIS WITH CONTRAST 04/21/2024 10:47:05 PM TECHNIQUE: CT of the abdomen and pelvis was performed with the administration of 75 mL of iohexol  (OMNIPAQUE ) 350 MG/ML injection. Multiplanar reformatted images are provided for review. Automated exposure control, iterative reconstruction, and/or weight-based  adjustment of the mA/kV was utilized to reduce the radiation dose to as low as reasonably achievable. COMPARISON: Comparison is made to 05/27/2017. CLINICAL HISTORY: Abdominal pain, acute, nonlocalized; Abdominal pain, post-op. Status post cholecystectomy April 12 2024 now with left lower quadrant abdominal pain and hematemesis. FINDINGS: LOWER CHEST: No acute abnormality. LIVER: The liver is unremarkable. GALLBLADDER AND BILE DUCTS: Status post cholecystectomy. Poorly circumscribed fluid collection is seen in the gallbladder fossa measuring 1.6 x 3.2 cm (series 23, image 3) also representing a postoperative hematoma or seroma. No intra or extrahepatic biliary ductal dilation. SPLEEN: No acute abnormality. PANCREAS: No acute  abnormality. ADRENAL GLANDS: No acute abnormality. KIDNEYS, URETERS AND BLADDER: No stones in the kidneys or ureters. No hydronephrosis. No perinephric or periureteral stranding. Urinary bladder is unremarkable. GI AND BOWEL: Stomach demonstrates no acute abnormality. The small and large bowel are unremarkable. Appendix normal. There is no bowel obstruction. PERITONEUM AND RETROPERITONEUM: Small free intraperitoneal gas is seen within the lower abdomen. Trace free fluid within the pelvis is nonspecific. VASCULATURE: Aorta is normal in caliber. LYMPH NODES: No lymphadenopathy. REPRODUCTIVE ORGANS: No acute abnormality. BONES AND SOFT TISSUES: No acute osseous abnormality. Punctate foci of subcutaneous gas seen within the left lower quadrant likely related to recent laparoscopic cholecystectomy. IMPRESSION: 1. Status post cholecystectomy with a 1.6 x 3.2 cm poorly circumscribed fluid collection in the gallbladder fossa, which may represent a postoperative hematoma or seroma 2. small free intraperitoneal and left lower quadrant subcutaneous gas likely related to recent laparoscopic surgery. Electronically signed by: Dorethia Molt MD 04/21/2024 10:57 PM EST RP Workstation: [id number]       Assessment/Plan   Principal Problem:   Epigastric pain Active Problems:   Attention deficit disorder   Nausea & vomiting   Transaminitis   Pyuria, sterile       #) acute epigastric pain: 2 days of new onset sharp, nonradiating epigastric discomfort associate with recurrent nausea and nonbloody, nonbilious emesis.  Etiology not entirely clear at this time.  Her presentation is in the context of being postop day #12 status post laparoscopic cholecystectomy on 04/10/2024 at [facility].  Presentation is associated with transaminitis, including mild elevation of alkaline phosphatase as well as total bilirubin with direct bilirubin predominance, raising the possibility of biliary obstruction.  However, the degree of elevation in patient's liver enzymes relative to most recent prior liver enzymes that would have been checked at [facility] is currently unclear.  Today CT abdomen/pelvis shows no evidence of biliary dilation and no overt evidence of choledocholithiasis.  Will pursue MRCP, as recommended by general surgery to further evaluate for evidence of biliary obstruction, with plan to contact gastroenterology if ensuing MRCP is suggestive of biliary obstruction.   Today's CT Abdo/pelvis also shows no evidence of acute hepatic pathology, will demonstrating a small poorly circumscribed fluid collection within the gallbladder fossa, which, per radiology report, may represent postoperative seroma versus hematoma, in the absence of any evidence of abscess, and no active contractors extravasation to suggest active bleed.  There is also a small amount of free intraperitoneal gas noted on today CT abdomen/pelvis, which appears consistent with an related to the patient's recent laparoscopic cholecystectomy, with today's CT Abdo/pelvis showing no evidence of bowel obstruction, abscess, or perforation.  Presenting lipase also noted to be elevated, but degree of elevation is unclear relative to most recent  prior lipase level that may have been checked at [facility].  Additionally, CT Abdo/pelvis today shows no evidence of acute pancreatic findings to suggest acute pancreatitis.  In evaluating risk factors for development of acute pancreatitis, it is currently unclear if the patient underwent ERCP as component of recent acute cholecystectomy at [facility].  EDP discussed patient's case with on-call general surgery, Dr. Signe, who recommended hospitalist admission and conveyed that general surgery will consult. In the meantime, Dr. Signe recommends MRCP to evaluate for choledocholithiasis, with prn GI consult if MRCP is positive for the latter.  No evidence of acute peritoneal findings on exam.  While she does have elevation white blood cell count, this may well be reactive in the setting of recent abdominal surgery as well as an  element of hemoconcentration due to recurrent nausea/vomiting and very limited oral intake over the last 2 days.  She does not appear jaundiced, noting only mild elevation in her total bilirubin level, and has remained afebrile throughout her ED course.  Consequently, criteria for ascending cholangitis are not met at this time.  Additionally, no evidence of hypotension or altered mental status.  In the absence of overt evidence of underlying infectious process, criteria for sepsis are not currently met.  However, for now, we will continue the Zosyn  initiated in the ED this evening, pending additional evaluation, as outlined below.  Plan: General Surgery formally consult, as above.  MRCP with as needed GI consult if MRCP is suggestive of biliary obstruction.  Lactated Ringer 's x 1 L bolus followed by continuous LR running at 125 cc/h.  Continue Zosyn  for now.  Prn IV Dilaudid ..  IV Zofran .  As needed IV Phenergan  for refractory nausea/vomiting.  Incentive spirometry.  CMP, CBC in the morning.  Check magnesium level.  Direct bilirubin and GGT levels in the morning.  Repeat lipase level in the  morning.  Check INR, PTT.  N.p.o. for now.  Protonix  40 mg IV daily.                       #) Asymptomatic Pyuria: Presenting urinalysis suggestive of pyuria, but in the the presence of squamous epithelial cells, there is suggestion of an element of contamination, likely contributing to a false elevation of her pyuria.  Additionally, no acute urinary symptoms.  She does present with abdominal discomfort, however this is located in upper abdominal distribution.  No evidence of ureteral stone on today CT abdomen/pelvis. overall, these findings appear most consistent with asymptomatic pyuria with suspicion for contamination, and do not appear to warrant independent antibiotic coverage for such.  Plan: Repeat CBC in the morning.  Follow for result of urinalysis.                      #) history of attention deficit disorder: Documented history of such, on methylphenidate  as an outpatient.  Plan: In the setting of current n.p.o. status, will hold home methylphenidate  for now.               DVT prophylaxis: SCD's   Code Status: Full code Family Communication: none Disposition Plan: Per Rounding Team Consults called: EDP discussed patient's case with on-call general surgery, Dr. Signe, who recommended hospitalist admission and conveyed that general surgery will consult. In the meantime, Dr. Signe recommends MRCP to evaluate for choledocholithiasis, with prn GI consult if MRCP is positive for the latter. ;  Admission status: obs     I SPENT GREATER THAN 75  MINUTES IN CLINICAL CARE TIME/MEDICAL DECISION-MAKING IN COMPLETING THIS ADMISSION.      Eva NOVAK Aqueelah Cotrell DO [facility]  From 7PM - 7AM   04/22/2024, 3:46 AM        [1] No Known Allergies  "

## 2024-04-22 NOTE — Consult Note (Signed)
 Surgical Evaluation Requesting provider: Dr. Jerrol  Chief Complaint: Abdominal pain, nausea  HPI: 33 year old woman who underwent uneventful robotic cholecystectomy with Dr. Johnie at [place] on 12/17 presents with abdominal pain (left-sided) nausea and vomiting for the last 2 days.  Her preoperative labs were unremarkable and she reports that she did well postoperatively until 2 days ago.  Lab work here is notable for a white count of 20.2, lipase greater than 2800, AST/ALT 856/954, alk phos 412, bilirubin 3.3.  CT demonstrates a small poorly circumscribed fluid collection in the gallbladder fossa measuring 3.2 x 1.6 cm suggestive of postoperative hematoma or seroma, no biliary dilatation.  Allergies[1]  Past Medical History:  Diagnosis Date   ADD (attention deficit disorder)    Anxiety     Past Surgical History:  Procedure Laterality Date   CHOLECYSTECTOMY, LAPAROSCOPIC     on 04/10/24 at [facility]   None      Family History  Problem Relation Age of Onset   Factor V Leiden deficiency Father    Thyroid disease Paternal Grandfather    Cancer Maternal Grandmother        bladder   Food Allergy Mother        fish   Heart disease Maternal Grandfather    Heart attack Maternal Grandfather    Arthritis Paternal Grandmother    Hypercholesterolemia Paternal Grandmother    Colon cancer Neg Hx    Liver cancer Neg Hx    Allergic rhinitis Neg Hx    Asthma Neg Hx    Eczema Neg Hx    Urticaria Neg Hx    Angioedema Neg Hx     Social History   Socioeconomic History   Marital status: Single    Spouse name: Not on file   Number of children: 0   Years of education: Not on file   Highest education level: Not on file  Occupational History   Occupation: student  Tobacco Use   Smoking status: Never   Smokeless tobacco: Never  Vaping Use   Vaping status: Never Used  Substance and Sexual Activity   Alcohol use: No   Drug use: No   Sexual activity: Not on file  Other Topics Concern    Not on file  Social History Narrative   Not on file   Social Drivers of Health   Tobacco Use: Low Risk (04/22/2024)   Patient History    Smoking Tobacco Use: Never    Smokeless Tobacco Use: Never    Passive Exposure: Not on file  Financial Resource Strain: Not on file  Food Insecurity: No Food Insecurity (05/13/2022)   Received from [facility] ([facility]   Epic    Within the past 12 months, you worried that your food would run out before you got the money to buy more.: Never true    Within the past 12 months, the food you bought just didn't last and you didn't have money to get more.: Never true  Transportation Needs: No Transportation Needs (05/13/2022)   Received from [facility] ([facility]   PRAPARE - Administrator, Civil Service (Medical): No    Lack of Transportation (Non-Medical): No  Physical Activity: Not on file  Stress: Not on file  Social Connections: Not on file  Depression (EYV7-0): Not on file  Alcohol Screen: Not on file  Housing: Not on file  Utilities: Not At Risk (05/13/2022)   Received from [facility] ([facility]   [facility] Utilities  Threatened with loss of utilities: No  Health Literacy: Not on file    Medications Ordered Prior to Encounter[2]  Review of Systems: a complete, 10pt review of systems was completed with pertinent positives and negatives as documented in the HPI  Physical Exam: Vitals:   04/21/24 2039 04/22/24 0139  BP: (!) 119/94 111/74  Pulse: 80 71  Resp: 16 16  Temp: 98.5 F (36.9 C) 98.2 F (36.8 C)  SpO2: 97% 96%   Gen: A&Ox3, no distress  Unlabored respirations Abdomen soft, nondistended, minimally tender across the upper abdomen.  Incisions are all clean, dry and intact with Dermabond.  Patient reports pain out of left sided port site which was reportedly the gallbladder extraction port site. Skin: warm and dry      Latest Ref Rng & Units 04/21/2024    8:53 PM 05/24/2017     3:02 PM 05/13/2017    9:55 PM  CBC  WBC 4.0 - 10.5 K/uL 20.2  6.7  10.8   Hemoglobin 12.0 - 15.0 g/dL 85.7  86.0  86.7   Hematocrit 36.0 - 46.0 % 42.7  40.8  39.5   Platelets 150 - 400 K/uL 406  322.0  308        Latest Ref Rng & Units 04/21/2024    8:53 PM 05/13/2017    9:55 PM  CMP  Glucose 70 - 99 mg/dL 885  96   BUN 6 - 20 mg/dL 14  8   Creatinine 9.55 - 1.00 mg/dL 9.04  9.21   Sodium 864 - 145 mmol/L 142  138   Potassium 3.5 - 5.1 mmol/L 4.1  3.7   Chloride 98 - 111 mmol/L 106  109   CO2 22 - 32 mmol/L 25  19   Calcium 8.9 - 10.3 mg/dL 9.4  9.1   Total Protein 6.5 - 8.1 g/dL 8.1  7.4   Total Bilirubin 0.0 - 1.2 mg/dL 3.3  0.7   Alkaline Phos 38 - 126 U/L 412  69   AST 15 - 41 U/L 856  20   ALT 0 - 44 U/L 954  16     No results found for: INR, PROTIME  Imaging: CT ABDOMEN PELVIS W CONTRAST Result Date: 04/21/2024 EXAM: CT ABDOMEN AND PELVIS WITH CONTRAST 04/21/2024 10:47:05 PM TECHNIQUE: CT of the abdomen and pelvis was performed with the administration of 75 mL of iohexol  (OMNIPAQUE ) 350 MG/ML injection. Multiplanar reformatted images are provided for review. Automated exposure control, iterative reconstruction, and/or weight-based adjustment of the mA/kV was utilized to reduce the radiation dose to as low as reasonably achievable. COMPARISON: Comparison is made to 05/27/2017. CLINICAL HISTORY: Abdominal pain, acute, nonlocalized; Abdominal pain, post-op. Status post cholecystectomy April 12 2024 now with left lower quadrant abdominal pain and hematemesis. FINDINGS: LOWER CHEST: No acute abnormality. LIVER: The liver is unremarkable. GALLBLADDER AND BILE DUCTS: Status post cholecystectomy. Poorly circumscribed fluid collection is seen in the gallbladder fossa measuring 1.6 x 3.2 cm (series 23, image 3) also representing a postoperative hematoma or seroma. No intra or extrahepatic biliary ductal dilation. SPLEEN: No acute abnormality. PANCREAS: No acute abnormality. ADRENAL  GLANDS: No acute abnormality. KIDNEYS, URETERS AND BLADDER: No stones in the kidneys or ureters. No hydronephrosis. No perinephric or periureteral stranding. Urinary bladder is unremarkable. GI AND BOWEL: Stomach demonstrates no acute abnormality. The small and large bowel are unremarkable. Appendix normal. There is no bowel obstruction. PERITONEUM AND RETROPERITONEUM: Small free intraperitoneal gas is seen within the lower abdomen. Trace  free fluid within the pelvis is nonspecific. VASCULATURE: Aorta is normal in caliber. LYMPH NODES: No lymphadenopathy. REPRODUCTIVE ORGANS: No acute abnormality. BONES AND SOFT TISSUES: No acute osseous abnormality. Punctate foci of subcutaneous gas seen within the left lower quadrant likely related to recent laparoscopic cholecystectomy. IMPRESSION: 1. Status post cholecystectomy with a 1.6 x 3.2 cm poorly circumscribed fluid collection in the gallbladder fossa, which may represent a postoperative hematoma or seroma 2. small free intraperitoneal and left lower quadrant subcutaneous gas likely related to recent laparoscopic surgery. Electronically signed by: Dorethia Molt MD 04/21/2024 10:57 PM EST RP Workstation: [id number]     A/P: Abdominal pain, nausea, and elevated lipase/LFTs now postop day 12 status post robotic cholecystectomy.  Recommend MRCP to evaluate for ongoing choledocholithiasis although biliary dilatation is not appreciated on CT scan.  Surgery team will follow.    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   Current Outpatient Medications on File  Prior to Encounter  Medication Sig Dispense Refill   dicyclomine  (BENTYL ) 10 MG capsule Take 1 tab every 6 hours as needed for abdominal cramping 40 capsule 2   dicyclomine  (BENTYL ) 20 MG tablet Take 1 tablet (20 mg total) by mouth 2 (two) times daily as needed for spasms. 20 tablet 0   methocarbamol  (ROBAXIN ) 500 MG tablet Take 1 tablet (500 mg total) by mouth 3 (three) times daily. 12 tablet 0   methylphenidate  36 MG PO CR tablet Take 36 mg by mouth 2 (two) times daily.     methylphenidate  36 MG PO CR tablet Take 2 tablets (72 mg total) by mouth in the morning. 60 tablet 0   norethindrone-ethinyl estradiol-iron (BLISOVI FE 1.5/30) 1.5-30 MG-MCG tablet Take 1 tablet by mouth daily.     ondansetron  (ZOFRAN ) 4 MG tablet Take 1 tablet (4 mg total) by mouth every 8 (eight) hours as needed for nausea or vomiting. 12 tablet 0   ondansetron  (ZOFRAN -ODT) 4 MG disintegrating tablet Take 1 tablet (4 mg total) by dissolving in the mouth every 6 (six) hours as needed. 20 tablet 0   oxyCODONE  (OXY IR/ROXICODONE ) 5 MG immediate release tablet Take one tablet (5 mg dose) by mouth every 8 (eight) hours as needed for up to 4 days. 12 tablet 0   [DISCONTINUED] promethazine  (PHENERGAN ) 25 MG suppository Place 1 suppository (25 mg total) rectally every 6 (six) hours as needed for nausea. 12 suppository 0

## 2024-04-22 NOTE — ED Notes (Signed)
 Patient vomiting. Made EDP aware. No orders at this time

## 2024-04-22 NOTE — ED Notes (Signed)
 Pt given warm blankets.

## 2024-04-22 NOTE — ED Notes (Signed)
 Zofran  given for nausea per MD order.

## 2024-04-22 NOTE — Progress Notes (Signed)
 Patient ID: Audrey Malone, female   DOB: [birth year], 33 y.o.   MRN: [id number]  MRCP reveals pancreatic inflammation without necrosis; no sign of choledocholithiasis.  Possible distal CBD stricture related to recently passed stone.  Lipase remains high, T. Bili down slightly.   MRCP WITH AND WITHOUT IV CONTRAST 04/22/2024 05:34:00 AM   TECHNIQUE: Multisequence, multiplanar magnetic resonance images of the abdomen with and without intravenous contrast. MRCP sequences were performed. 8 mL gadobutrol  (GADAVIST ) 1 MMOL/ML injection 8 mL GADOBUTROL  1 MMOL/ML IV SOLN.   COMPARISON: 04/21/2024 status post cholecystectomy.   CLINICAL HISTORY: Pancreatitis suspected; Evaluate for choledocholithiasis, status post cholecystectomy.   FINDINGS:   LIVER: Unremarkable.   GALLBLADDER AND BILIARY SYSTEM: Status post cholecystectomy. Common bile duct measures up to 3 mm in maximum diameter. No signs of choledocholithiasis. A short segment of luminal narrowing of the distal common bile duct just before the ampulla is noted, which may reflect a stricture, possibly due to a recently passed stone. No intrahepatic or extrahepatic ductal dilation.   SPLEEN: Unremarkable.   PANCREAS/PANCREATIC DUCT: There is mild diffuse edema about the pancreas compatible with no acute pancreatitis. Small volume of peripancreatic soft tissue edema and fluid noted. No signs of pancreatic necrosis or pseudocyst. No main duct dilatation or mass.   ADRENAL GLANDS: Unremarkable.   KIDNEYS: Unremarkable.   LYMPH NODES: No enlarged abdominal lymph nodes.   VASCULATURE: Unremarkable.   PERITONEUM: No significant fluid collections identified.   ABDOMINAL WALL: No hernia. No mass.   BOWEL: Grossly unremarkable. No bowel obstruction.   BONES: No acute abnormality or worrisome osseous lesion.   SOFT TISSUES: Unremarkable.   MISCELLANEOUS: Unremarkable.   IMPRESSION: 1. Mild diffuse pancreatic edema  compatible with acute pancreatitis, with small volume peripancreatic soft tissue edema and fluid, and no pancreatic necrosis or pseudocyst. 2. Status post cholecystectomy. No common bile duct dilatation. 3. Short segment luminal narrowing of the distal common bile duct just proximal to the ampulla, which may reflect a stricture related to a recently passed stone, with no choledocholithiasis.   Electronically signed by: Waddell Calk MD 04/22/2024 05:51 AM EST RP Workstation: [id number]   Impression:  Pancreatitis likely secondary to choledocholithiasis that may have passed.  No sign of bile leak. No surgical intervention or ERCP indicated at this time.  Would manage pancreatitis with bowel rest and IV hydration until symptoms improve and labs decrease.  Donnice POUR. Belinda, MD, [facility]  General Surgery

## 2024-04-22 NOTE — ED Provider Notes (Signed)
 " [facility] Provider Note   CSN: [id number] Arrival date & time: 04/21/24  2035     Patient presents with: Abdominal Pain and Emesis   Audrey Malone is a 33 y.o. female.    Abdominal Pain Associated symptoms: nausea and vomiting   Emesis Associated symptoms: abdominal pain      33 year old female with medical history significant for cholecystitis and recent laparoscopic cholecystectomy at [facility] on 12/17 with Dr. Johnie who presents to the emergency department with uncontrolled nausea and vomiting, epigastric abdominal pain.  The patient states that she had been doing well postoperatively throughout Christmas but then yesterday developed worsening upper abdominal pain with associated NBNB emesis.  Some darker component and red streaks in her emesis initially but she is now vomiting clear liquid.  No bile in her vomit.  She endorses 8 out of 10 upper abdominal pain.  Her last bowel movement was 2 days ago.  She denies any fevers or chills.  The patient states that she presented to the [facility] ER rather than [facility] because she was in acute pain and [facility] was closer.  Prior to Admission medications  Medication Sig Start Date End Date Taking? Authorizing Provider  dicyclomine  (BENTYL ) 10 MG capsule Take 1 tab every 6 hours as needed for abdominal cramping 06/22/17   Esterwood, Amy S, PA-C  dicyclomine  (BENTYL ) 20 MG tablet Take 1 tablet (20 mg total) by mouth 2 (two) times daily as needed for spasms. 05/14/17   Griselda Norris, MD  methocarbamol  (ROBAXIN ) 500 MG tablet Take 1 tablet (500 mg total) by mouth 3 (three) times daily. 04/10/24     methylphenidate  36 MG PO CR tablet Take 36 mg by mouth 2 (two) times daily.    [provider]  methylphenidate  36 MG PO CR tablet Take 2 tablets (72 mg total) by mouth in the morning. 04/05/24     norethindrone-ethinyl estradiol-iron (BLISOVI FE 1.5/30) 1.5-30 MG-MCG tablet Take 1 tablet by mouth daily.     [provider]  ondansetron  (ZOFRAN ) 4 MG tablet Take 1 tablet (4 mg total) by mouth every 8 (eight) hours as needed for nausea or vomiting. 05/14/17   Griselda Norris, MD  ondansetron  (ZOFRAN -ODT) 4 MG disintegrating tablet Take 1 tablet (4 mg total) by dissolving in the mouth every 6 (six) hours as needed. 04/09/24     oxyCODONE  (OXY IR/ROXICODONE ) 5 MG immediate release tablet Take one tablet (5 mg dose) by mouth every 8 (eight) hours as needed for up to 4 days. 04/10/24     promethazine  (PHENERGAN ) 25 MG suppository Place 1 suppository (25 mg total) rectally every 6 (six) hours as needed for nausea. 04/09/24 04/10/24      Allergies: Patient has no known allergies.    Review of Systems  Gastrointestinal:  Positive for abdominal pain, nausea and vomiting.  All other systems reviewed and are negative.   Updated Vital Signs BP 111/74   Pulse 71   Temp 98.2 F (36.8 C) (Oral)   Resp 16   Ht 5' 5 (1.651 m)   Wt 83.9 kg   LMP 04/19/2024   SpO2 96%   BMI 30.79 kg/m   Physical Exam Vitals and nursing note reviewed.  Constitutional:      General: She is not in acute distress.    Appearance: She is well-developed.  HENT:     Head: Normocephalic and atraumatic.  Eyes:     Conjunctiva/sclera: Conjunctivae normal.  Cardiovascular:  Rate and Rhythm: Normal rate and regular rhythm.     Heart sounds: No murmur heard. Pulmonary:     Effort: Pulmonary effort is normal. No respiratory distress.     Breath sounds: Normal breath sounds.  Abdominal:     Palpations: Abdomen is soft.     Tenderness: There is abdominal tenderness in the epigastric area. There is guarding.     Comments: Incision sites appear to be well-healing, no surrounding erythema, upper abdominal tenderness worse in the epigastrium with guarding  Musculoskeletal:        General: No swelling.     Cervical back: Neck supple.  Skin:    General: Skin is warm and dry.     Capillary Refill: Capillary refill  takes less than 2 seconds.  Neurological:     Mental Status: She is alert.  Psychiatric:        Mood and Affect: Mood normal.     (all labs ordered are listed, but only abnormal results are displayed) Labs Reviewed  LIPASE, BLOOD - Abnormal; Notable for the following components:      Result Value   Lipase >2,800 (*)    All other components within normal limits  COMPREHENSIVE METABOLIC PANEL WITH GFR - Abnormal; Notable for the following components:   Glucose, Bld 114 (*)    AST 856 (*)    ALT 954 (*)    Alkaline Phosphatase 412 (*)    Total Bilirubin 3.3 (*)    All other components within normal limits  CBC - Abnormal; Notable for the following components:   WBC 20.2 (*)    Platelets 406 (*)    All other components within normal limits  URINALYSIS, ROUTINE W REFLEX MICROSCOPIC - Abnormal; Notable for the following components:   Color, Urine AMBER (*)    APPearance CLOUDY (*)    Hgb urine dipstick LARGE (*)    Bilirubin Urine SMALL (*)    Protein, ur 30 (*)    Leukocytes,Ua MODERATE (*)    Bacteria, UA FEW (*)    All other components within normal limits  BILIRUBIN, DIRECT - Abnormal; Notable for the following components:   Bilirubin, Direct 2.3 (*)    All other components within normal limits  HCG, SERUM, QUALITATIVE  CBC WITH DIFFERENTIAL/PLATELET  COMPREHENSIVE METABOLIC PANEL WITH GFR  MAGNESIUM    EKG: None  Radiology: CT ABDOMEN PELVIS W CONTRAST Result Date: 04/21/2024 EXAM: CT ABDOMEN AND PELVIS WITH CONTRAST 04/21/2024 10:47:05 PM TECHNIQUE: CT of the abdomen and pelvis was performed with the administration of 75 mL of iohexol  (OMNIPAQUE ) 350 MG/ML injection. Multiplanar reformatted images are provided for review. Automated exposure control, iterative reconstruction, and/or weight-based adjustment of the mA/kV was utilized to reduce the radiation dose to as low as reasonably achievable. COMPARISON: Comparison is made to 05/27/2017. CLINICAL HISTORY: Abdominal  pain, acute, nonlocalized; Abdominal pain, post-op. Status post cholecystectomy April 12 2024 now with left lower quadrant abdominal pain and hematemesis. FINDINGS: LOWER CHEST: No acute abnormality. LIVER: The liver is unremarkable. GALLBLADDER AND BILE DUCTS: Status post cholecystectomy. Poorly circumscribed fluid collection is seen in the gallbladder fossa measuring 1.6 x 3.2 cm (series 23, image 3) also representing a postoperative hematoma or seroma. No intra or extrahepatic biliary ductal dilation. SPLEEN: No acute abnormality. PANCREAS: No acute abnormality. ADRENAL GLANDS: No acute abnormality. KIDNEYS, URETERS AND BLADDER: No stones in the kidneys or ureters. No hydronephrosis. No perinephric or periureteral stranding. Urinary bladder is unremarkable. GI AND BOWEL: Stomach demonstrates no acute  abnormality. The small and large bowel are unremarkable. Appendix normal. There is no bowel obstruction. PERITONEUM AND RETROPERITONEUM: Small free intraperitoneal gas is seen within the lower abdomen. Trace free fluid within the pelvis is nonspecific. VASCULATURE: Aorta is normal in caliber. LYMPH NODES: No lymphadenopathy. REPRODUCTIVE ORGANS: No acute abnormality. BONES AND SOFT TISSUES: No acute osseous abnormality. Punctate foci of subcutaneous gas seen within the left lower quadrant likely related to recent laparoscopic cholecystectomy. IMPRESSION: 1. Status post cholecystectomy with a 1.6 x 3.2 cm poorly circumscribed fluid collection in the gallbladder fossa, which may represent a postoperative hematoma or seroma 2. small free intraperitoneal and left lower quadrant subcutaneous gas likely related to recent laparoscopic surgery. Electronically signed by: Dorethia Molt MD 04/21/2024 10:57 PM EST RP Workstation: [id number]     Procedures   Medications Ordered in the ED  lactated ringers  bolus 1,000 mL (has no administration in time range)  lactated ringers  infusion (has no administration in time  range)  acetaminophen  (TYLENOL ) tablet 650 mg (has no administration in time range)    Or  acetaminophen  (TYLENOL ) suppository 650 mg (has no administration in time range)  ondansetron  (ZOFRAN ) injection 4 mg (has no administration in time range)  promethazine  (PHENERGAN ) 12.5 mg in sodium chloride  0.9 % 50 mL IVPB (has no administration in time range)  HYDROmorphone  (DILAUDID ) injection 0.5 mg (has no administration in time range)  pantoprazole  (PROTONIX ) injection 40 mg (has no administration in time range)  morphine  (PF) 4 MG/ML injection 4 mg (4 mg Intravenous Given 04/21/24 2120)  ondansetron  (ZOFRAN ) injection 4 mg (4 mg Intravenous Given 04/21/24 2120)  lactated ringers  bolus 1,000 mL (1,000 mLs Intravenous New Bag/Given 04/22/24 0132)  iohexol  (OMNIPAQUE ) 350 MG/ML injection 75 mL (75 mLs Intravenous Contrast Given 04/21/24 2247)  famotidine  (PEPCID ) IVPB 20 mg premix (0 mg Intravenous Stopped 04/22/24 0212)  piperacillin -tazobactam (ZOSYN ) IVPB 3.375 g (0 g Intravenous Stopped 04/22/24 0237)  ondansetron  (ZOFRAN ) injection 4 mg (4 mg Intravenous Given 04/22/24 0141)  metoCLOPramide  (REGLAN ) injection 10 mg (10 mg Intravenous Given 04/22/24 0253)  diphenhydrAMINE  (BENADRYL ) injection 12.5 mg (12.5 mg Intravenous Given 04/22/24 0254)    Clinical Course as of 04/22/24 0309  Mon Apr 22, 2024  0117 WBC(!): 20.2 [JL]  0117 AST(!): 856 [JL]  0117 ALT(!): 954 [JL]  0117 Total Bilirubin(!): 3.3 [JL]  0117 Lipase(!): >2,800 [JL]  0215 Bilirubin, Direct(!): 2.3 [JL]    Clinical Course User Index [JL] Jerrol Agent, MD                                 Medical Decision Making Amount and/or Complexity of Data Reviewed Labs: ordered. Decision-making details documented in ED Course.  Risk Prescription drug management. Decision regarding hospitalization.    34 year old female with medical history significant for cholecystitis and recent laparoscopic cholecystectomy at [facility] on 12/17  with Dr. Johnie who presents to the emergency department with uncontrolled nausea and vomiting, epigastric abdominal pain.  The patient states that she had been doing well postoperatively throughout Christmas but then yesterday developed worsening upper abdominal pain with associated NBNB emesis.  Some darker component and red streaks in her emesis initially but she is now vomiting clear liquid.  No bile in her vomit.  She endorses 8 out of 10 upper abdominal pain.  Her last bowel movement was 2 days ago.  She denies any fevers or chills. The patient states that she presented to the [facility]  ER rather than [facility] because she was in acute pain and [facility] was closer.  On arrival, the patient was afebrile, nontachycardic, hemodynamically stable, saturating well on room air.  Patient presenting with nausea, vomiting, upper abdominal pain after initial improvement status postcholecystectomy.  Differential diagnosis includes postoperative pancreatitis, small bowel obstruction, retained/migrated, bile duct stone, bile duct injury with leak or obstruction.  Considered but feel ascending cholangitis to be less likely.  IV access was obtained and in triage the patient was administered IV morphine  and IV Zofran .  Patient endorsing 8 out of 10 upper abdominal pain, improvement in her nausea after these and initial interventions.  On my evaluation, patient with upper abdominal tenderness to palpation with guarding.  Postoperative incision sites appear to be well-healing with no surrounding erythema.  Labs: hCG negative, lipase greater than 2800, AST 856, ALT 954, alkaline phosphatase elevated to 412, T. bili elevated at 3.3, direct bili elevated at 2.3, CBC with a leukocytosis to 20, urinalysis positive for UTI with moderate leukocytes, 20-50 WBCs and few bacteria present.  Patient was administered additional Dilaudid  for pain control, ordered an IV fluid bolus in addition to IV Pepcid  and Zosyn .  General Surgery: Spoke with  Dr. Signe of on-call general surgery, recommended MRCP inpatient, will follow, recommended engaging GI if MRCP shows choledocholithiasis.  Medicine consulted for admission, Dr. Marcene accepting.     Final diagnoses:  Epigastric pain  Acute pancreatitis, unspecified complication status, unspecified pancreatitis type  Elevated LFTs  S/P laparoscopic cholecystectomy    ED Discharge Orders     None          Jerrol Agent, MD 04/22/24 0309  "

## 2024-04-23 DIAGNOSIS — K859 Acute pancreatitis without necrosis or infection, unspecified: Secondary | ICD-10-CM

## 2024-04-23 LAB — COMPREHENSIVE METABOLIC PANEL WITH GFR
ALT: 414 U/L — ABNORMAL HIGH (ref 0–44)
AST: 166 U/L — ABNORMAL HIGH (ref 15–41)
Albumin: 3.2 g/dL — ABNORMAL LOW (ref 3.5–5.0)
Alkaline Phosphatase: 284 U/L — ABNORMAL HIGH (ref 38–126)
Anion gap: 12 (ref 5–15)
BUN: 16 mg/dL (ref 6–20)
CO2: 23 mmol/L (ref 22–32)
Calcium: 8.7 mg/dL — ABNORMAL LOW (ref 8.9–10.3)
Chloride: 106 mmol/L (ref 98–111)
Creatinine, Ser: 0.68 mg/dL (ref 0.44–1.00)
GFR, Estimated: >60 mL/min
Glucose, Bld: 61 mg/dL — ABNORMAL LOW (ref 70–99)
Potassium: 3.3 mmol/L — ABNORMAL LOW (ref 3.5–5.1)
Sodium: 141 mmol/L (ref 135–145)
Total Bilirubin: 0.6 mg/dL (ref 0.0–1.2)
Total Protein: 6 g/dL — ABNORMAL LOW (ref 6.5–8.1)

## 2024-04-23 LAB — LIPASE, BLOOD: Lipase: 179 U/L — ABNORMAL HIGH (ref 11–51)

## 2024-04-23 LAB — CBC
HCT: 34.4 % — ABNORMAL LOW (ref 36.0–46.0)
Hemoglobin: 11.4 g/dL — ABNORMAL LOW (ref 12.0–15.0)
MCH: 31.1 pg (ref 26.0–34.0)
MCHC: 33.1 g/dL (ref 30.0–36.0)
MCV: 94 fL (ref 80.0–100.0)
Platelets: 333 K/uL (ref 150–400)
RBC: 3.66 MIL/uL — ABNORMAL LOW (ref 3.87–5.11)
RDW: 14.2 % (ref 11.5–15.5)
WBC: 10.7 K/uL — ABNORMAL HIGH (ref 4.0–10.5)
nRBC: 0 % (ref 0.0–0.2)

## 2024-04-23 MED ORDER — SENNOSIDES-DOCUSATE SODIUM 8.6-50 MG PO TABS
1.0000 | ORAL_TABLET | Freq: Two times a day (BID) | ORAL | Status: DC
  Administered 2024-04-23 – 2024-04-24 (×4): 1 via ORAL
  Filled 2024-04-23 (×4): qty 1

## 2024-04-23 NOTE — Progress Notes (Addendum)
 "    Subjective/Chief Complaint: Patient feels better today than compared to admission. No further nausea or vomiting Afebrile Mild LUQ tenderness  No new labs today   Objective: Vital signs in last 24 hours: Temp:  [97.7 F (36.5 C)-98.8 F (37.1 C)] 97.8 F (36.6 C) (12/30 0406) Pulse Rate:  [70-84] 79 (12/29 2330) Resp:  [16-17] 16 (12/30 0406) BP: (88-110)/(54-62) 110/62 (12/29 2330) SpO2:  [95 %-100 %] 95 % (12/30 0406) Weight:  [88.6 kg] 88.6 kg (12/30 0406)    Intake/Output from previous day: 12/29 0701 - 12/30 0700 In: 1760 [I.V.:760; IV Piggyback:1000] Out: -  Intake/Output this shift: No intake/output data recorded.  WDWN in NAD No scleral icterus Abd - soft, mild LUQ tenderness  Incisions c/d/I  Lab Results:  Recent Labs    04/21/24 2053 04/22/24 0437  WBC 20.2* 13.2*  HGB 14.2 12.7  HCT 42.7 37.8  PLT 406* 364   BMET Recent Labs    04/21/24 2053 04/22/24 0437  NA 142 142  K 4.1 4.1  CL 106 107  CO2 25 24  GLUCOSE 114* 150*  BUN 14 14  CREATININE 0.95 0.79  CALCIUM 9.4 9.0   PT/INR Recent Labs    04/22/24 0437  LABPROT 14.4  INR 1.1      Latest Ref Rng & Units 04/22/2024    4:37 AM 04/21/2024    8:53 PM 05/13/2017    9:55 PM  Hepatic Function  Total Protein 6.5 - 8.1 g/dL 7.1  8.1  7.4   Albumin 3.5 - 5.0 g/dL 3.8  4.2  3.9   AST 15 - 41 U/L 613  856  20   ALT 0 - 44 U/L 800  954  16   Alk Phosphatase 38 - 126 U/L 395  412  69   Total Bilirubin 0.0 - 1.2 mg/dL 2.3  3.3  0.7   Bilirubin, Direct 0.0 - 0.2 mg/dL 1.7  2.3       Studies/Results: MR ABDOMEN MRCP W WO CONTAST Result Date: 04/22/2024 EXAM: MRCP WITH AND WITHOUT IV CONTRAST 04/22/2024 05:34:00 AM TECHNIQUE: Multisequence, multiplanar magnetic resonance images of the abdomen with and without intravenous contrast. MRCP sequences were performed. 8 mL gadobutrol  (GADAVIST ) 1 MMOL/ML injection 8 mL GADOBUTROL  1 MMOL/ML IV SOLN. COMPARISON: 04/21/2024 status post  cholecystectomy. CLINICAL HISTORY: Pancreatitis suspected; Evaluate for choledocholithiasis, status post cholecystectomy. FINDINGS: LIVER: Unremarkable. GALLBLADDER AND BILIARY SYSTEM: Status post cholecystectomy. Common bile duct measures up to 3 mm in maximum diameter. No signs of choledocholithiasis. A short segment of luminal narrowing of the distal common bile duct just before the ampulla is noted, which may reflect a stricture, possibly due to a recently passed stone. No intrahepatic or extrahepatic ductal dilation. SPLEEN: Unremarkable. PANCREAS/PANCREATIC DUCT: There is mild diffuse edema about the pancreas compatible with no acute pancreatitis. Small volume of peripancreatic soft tissue edema and fluid noted. No signs of pancreatic necrosis or pseudocyst. No main duct dilatation or mass. ADRENAL GLANDS: Unremarkable. KIDNEYS: Unremarkable. LYMPH NODES: No enlarged abdominal lymph nodes. VASCULATURE: Unremarkable. PERITONEUM: No significant fluid collections identified. ABDOMINAL WALL: No hernia. No mass. BOWEL: Grossly unremarkable. No bowel obstruction. BONES: No acute abnormality or worrisome osseous lesion. SOFT TISSUES: Unremarkable. MISCELLANEOUS: Unremarkable. IMPRESSION: 1. Mild diffuse pancreatic edema compatible with acute pancreatitis, with small volume peripancreatic soft tissue edema and fluid, and no pancreatic necrosis or pseudocyst. 2. Status post cholecystectomy. No common bile duct dilatation. 3. Short segment luminal narrowing of the distal common bile duct  just proximal to the ampulla, which may reflect a stricture related to a recently passed stone, with no choledocholithiasis. Electronically signed by: Waddell Calk MD 04/22/2024 05:51 AM EST RP Workstation: [id number]   MR 3D Recon At Scanner Result Date: 04/22/2024 EXAM: MRCP WITH AND WITHOUT IV CONTRAST 04/22/2024 05:34:00 AM TECHNIQUE: Multisequence, multiplanar magnetic resonance images of the abdomen with and without  intravenous contrast. MRCP sequences were performed. 8 mL gadobutrol  (GADAVIST ) 1 MMOL/ML injection 8 mL GADOBUTROL  1 MMOL/ML IV SOLN. COMPARISON: 04/21/2024 status post cholecystectomy. CLINICAL HISTORY: Pancreatitis suspected; Evaluate for choledocholithiasis, status post cholecystectomy. FINDINGS: LIVER: Unremarkable. GALLBLADDER AND BILIARY SYSTEM: Status post cholecystectomy. Common bile duct measures up to 3 mm in maximum diameter. No signs of choledocholithiasis. A short segment of luminal narrowing of the distal common bile duct just before the ampulla is noted, which may reflect a stricture, possibly due to a recently passed stone. No intrahepatic or extrahepatic ductal dilation. SPLEEN: Unremarkable. PANCREAS/PANCREATIC DUCT: There is mild diffuse edema about the pancreas compatible with no acute pancreatitis. Small volume of peripancreatic soft tissue edema and fluid noted. No signs of pancreatic necrosis or pseudocyst. No main duct dilatation or mass. ADRENAL GLANDS: Unremarkable. KIDNEYS: Unremarkable. LYMPH NODES: No enlarged abdominal lymph nodes. VASCULATURE: Unremarkable. PERITONEUM: No significant fluid collections identified. ABDOMINAL WALL: No hernia. No mass. BOWEL: Grossly unremarkable. No bowel obstruction. BONES: No acute abnormality or worrisome osseous lesion. SOFT TISSUES: Unremarkable. MISCELLANEOUS: Unremarkable. IMPRESSION: 1. Mild diffuse pancreatic edema compatible with acute pancreatitis, with small volume peripancreatic soft tissue edema and fluid, and no pancreatic necrosis or pseudocyst. 2. Status post cholecystectomy. No common bile duct dilatation. 3. Short segment luminal narrowing of the distal common bile duct just proximal to the ampulla, which may reflect a stricture related to a recently passed stone, with no choledocholithiasis. Electronically signed by: Waddell Calk MD 04/22/2024 05:51 AM EST RP Workstation: [id number]   CT ABDOMEN PELVIS W CONTRAST Result Date:  04/21/2024 EXAM: CT ABDOMEN AND PELVIS WITH CONTRAST 04/21/2024 10:47:05 PM TECHNIQUE: CT of the abdomen and pelvis was performed with the administration of 75 mL of iohexol  (OMNIPAQUE ) 350 MG/ML injection. Multiplanar reformatted images are provided for review. Automated exposure control, iterative reconstruction, and/or weight-based adjustment of the mA/kV was utilized to reduce the radiation dose to as low as reasonably achievable. COMPARISON: Comparison is made to 05/27/2017. CLINICAL HISTORY: Abdominal pain, acute, nonlocalized; Abdominal pain, post-op. Status post cholecystectomy April 12 2024 now with left lower quadrant abdominal pain and hematemesis. FINDINGS: LOWER CHEST: No acute abnormality. LIVER: The liver is unremarkable. GALLBLADDER AND BILE DUCTS: Status post cholecystectomy. Poorly circumscribed fluid collection is seen in the gallbladder fossa measuring 1.6 x 3.2 cm (series 23, image 3) also representing a postoperative hematoma or seroma. No intra or extrahepatic biliary ductal dilation. SPLEEN: No acute abnormality. PANCREAS: No acute abnormality. ADRENAL GLANDS: No acute abnormality. KIDNEYS, URETERS AND BLADDER: No stones in the kidneys or ureters. No hydronephrosis. No perinephric or periureteral stranding. Urinary bladder is unremarkable. GI AND BOWEL: Stomach demonstrates no acute abnormality. The small and large bowel are unremarkable. Appendix normal. There is no bowel obstruction. PERITONEUM AND RETROPERITONEUM: Small free intraperitoneal gas is seen within the lower abdomen. Trace free fluid within the pelvis is nonspecific. VASCULATURE: Aorta is normal in caliber. LYMPH NODES: No lymphadenopathy. REPRODUCTIVE ORGANS: No acute abnormality. BONES AND SOFT TISSUES: No acute osseous abnormality. Punctate foci of subcutaneous gas seen within the left lower quadrant likely related to recent laparoscopic cholecystectomy. IMPRESSION: 1. Status post cholecystectomy  with a 1.6 x 3.2 cm  poorly circumscribed fluid collection in the gallbladder fossa, which may represent a postoperative hematoma or seroma 2. small free intraperitoneal and left lower quadrant subcutaneous gas likely related to recent laparoscopic surgery. Electronically signed by: Dorethia Molt MD 04/21/2024 10:57 PM EST RP Workstation: [id number]    Anti-infectives: Anti-infectives (From admission, onward)    Start     Dose/Rate Route Frequency Ordered Stop   04/22/24 1000  piperacillin -tazobactam (ZOSYN ) IVPB 3.375 g        3.375 g 12.5 mL/hr over 240 Minutes Intravenous Every 8 hours 04/22/24 0354     04/22/24 0145  piperacillin -tazobactam (ZOSYN ) IVPB 3.375 g        3.375 g 100 mL/hr over 30 Minutes Intravenous  Once 04/22/24 0136 04/22/24 0237   04/22/24 0115  cefTRIAXone (ROCEPHIN) 1 g in sodium chloride  0.9 % 100 mL IVPB  Status:  Discontinued        1 g 200 mL/hr over 30 Minutes Intravenous  Once 04/22/24 0106 04/22/24 0120       Assessment/Plan: s/p laparoscopic cholecystectomy 04/10/24 by Dr. Sidra Pine at [facility] Acute pancreatitis - probably retained choledocholithiasis that has now passed.  MRCP showed mild narrowing in the distal common bile duct consistent with passed stone  Recs:  Ice chips Will order follow-up labs today.  If labs continue to improve, may consider starting clear liquids. No strong indication for IV antibiotics.  If WBC continues to decrease, will D/C abx. Encourage ambulation   LOS: 0 days    Donnice MARLA Lima 04/23/2024  Addendum:   Lab Results  Component Value Date   WBC 10.7 (H) 04/23/2024   HGB 11.4 (L) 04/23/2024   HCT 34.4 (L) 04/23/2024   MCV 94.0 04/23/2024   PLT 333 04/23/2024   Lab Results  Component Value Date   NA 141 04/23/2024   CL 106 04/23/2024   K 3.3 (L) 04/23/2024   CO2 23 04/23/2024   BUN 16 04/23/2024   CREATININE 0.68 04/23/2024   GFRNONAA >60 04/23/2024   CALCIUM 8.7 (L) 04/23/2024   ALBUMIN 3.2 (L) 04/23/2024    GLUCOSE 61 (L) 04/23/2024   Lab Results  Component Value Date   ALT 414 (H) 04/23/2024   AST 166 (H) 04/23/2024   GGT 388 (H) 04/22/2024   ALKPHOS 284 (H) 04/23/2024   BILITOT 0.6 04/23/2024   Lab Results  Component Value Date   LIPASE 179 (H) 04/23/2024   Labs improving Resolving pancreatitis with no sign of ongoing obstruction Clear liquids Replete K.  Advance diet slowly as tolerated. We will sign off for now.  Please call us  back if there are any questions.  Donnice MARLA. Lima, MD, [facility]  General Surgery   04/23/2024 10:10 AM   "

## 2024-04-23 NOTE — Progress Notes (Signed)
 Progress Note    Audrey Malone   FMW:[id number]  DOB: 01-15-1991  DOA: 04/21/2024     0 PCP: Ricky Alfrieda DASEN, DO  Initial CC: Epigastric pain  Hospital Course: Audrey Malone is a 33 y.o. female with medical history significant for ADD, laparoscopic cholecystectomy on 04/10/2024, who is admitted to [facility] on 04/21/2024 with acute epigastric pain after presenting from home to [facility] ED complaining of abdominal pain.        Assessment and Plan:   Acute pancreatitis - Presumed retained/passed stone after recent cholecystectomy - LFTs suggestive of obstructive pattern on admission - MRCP negative for choledocholithiasis; no CBD dilation as well - start CLD today and monitor tolerance - Continue pain and nausea control - Ambulate as tolerated - Continue fluids for now - Repeat LFTs in a.m.  Interval History:  Feeling a bit better today.  Resting in bed comfortably with no major complaints of nausea and no vomiting.  Abdominal pain has also improved significantly.    Antimicrobials:   DVT prophylaxis:  SCDs Start: 04/22/24 0306   Code Status:   Code Status: Full Code  Mobility Assessment (Last 72 Hours)     Mobility Assessment   No documentation.     Diet: Diet Orders (From admission, onward)     Start     Ordered   04/23/24 1009  Diet clear liquid Fluid consistency: Thin  Diet effective now       Question:  Fluid consistency:  Answer:  Thin   04/23/24 1009            Barriers to discharge: None Disposition Plan: Home HH orders placed: None Status is: Observation  Objective: Blood pressure 110/67, pulse 95, temperature 98.2 F (36.8 C), temperature source Oral, resp. rate 16, height 5' 5 (1.651 m), weight 88.6 kg, last menstrual period 04/19/2024, SpO2 99%.  Examination:  Physical Exam Constitutional:      Appearance: Normal appearance.  HENT:     Head: Normocephalic and atraumatic.     Mouth/Throat:     Mouth: Mucous membranes are moist.   Eyes:     Extraocular Movements: Extraocular movements intact.  Cardiovascular:     Rate and Rhythm: Normal rate and regular rhythm.  Pulmonary:     Effort: Pulmonary effort is normal. No respiratory distress.     Breath sounds: Normal breath sounds. No wheezing.  Abdominal:     General: Bowel sounds are normal. There is no distension.     Palpations: Abdomen is soft.     Tenderness: There is no abdominal tenderness.  Musculoskeletal:        General: Normal range of motion.     Cervical back: Normal range of motion and neck supple.  Skin:    General: Skin is warm and dry.  Neurological:     General: No focal deficit present.     Mental Status: She is alert.  Psychiatric:        Mood and Affect: Mood normal.      Consultants:  General surgery  Procedures:    Data Reviewed: Results for orders placed or performed during the hospital encounter of 04/21/24 (from the past 24 hours)  CBC     Status: Abnormal   Collection Time: 04/23/24  8:25 AM  Result Value Ref Range   WBC 10.7 (H) 4.0 - 10.5 K/uL   RBC 3.66 (L) 3.87 - 5.11 MIL/uL   Hemoglobin 11.4 (L) 12.0 - 15.0 g/dL   HCT 65.5 (L)  36.0 - 46.0 %   MCV 94.0 80.0 - 100.0 fL   MCH 31.1 26.0 - 34.0 pg   MCHC 33.1 30.0 - 36.0 g/dL   RDW 85.7 88.4 - 84.4 %   Platelets 333 150 - 400 K/uL   nRBC 0.0 0.0 - 0.2 %  Comprehensive metabolic panel     Status: Abnormal   Collection Time: 04/23/24  8:25 AM  Result Value Ref Range   Sodium 141 135 - 145 mmol/L   Potassium 3.3 (L) 3.5 - 5.1 mmol/L   Chloride 106 98 - 111 mmol/L   CO2 23 22 - 32 mmol/L   Glucose, Bld 61 (L) 70 - 99 mg/dL   BUN 16 6 - 20 mg/dL   Creatinine, Ser 9.31 0.44 - 1.00 mg/dL   Calcium 8.7 (L) 8.9 - 10.3 mg/dL   Total Protein 6.0 (L) 6.5 - 8.1 g/dL   Albumin 3.2 (L) 3.5 - 5.0 g/dL   AST 833 (H) 15 - 41 U/L   ALT 414 (H) 0 - 44 U/L   Alkaline Phosphatase 284 (H) 38 - 126 U/L   Total Bilirubin 0.6 0.0 - 1.2 mg/dL   GFR, Estimated >39 >39 mL/min   Anion  gap 12 5 - 15  Lipase, blood     Status: Abnormal   Collection Time: 04/23/24  8:25 AM  Result Value Ref Range   Lipase 179 (H) 11 - 51 U/L    I have reviewed pertinent nursing notes, vitals, labs, and images as necessary. I have ordered labwork to follow up on as indicated.  I have reviewed the last notes from staff over past 24 hours. I have discussed patient's care plan and test results with nursing staff, CM/SW, and other staff as appropriate.  Old records reviewed in assessment of this patient  Time spent: Greater than 50% of the 55 minute visit was spent in counseling/coordination of care for the patient as laid out in the A&P.   LOS: 0 days   Alm Apo, MD [facility] 04/23/2024, 1:32 PM

## 2024-04-23 NOTE — Plan of Care (Signed)
" °  Problem: Education: Goal: Knowledge of General Education information will improve Description: Including pain rating scale, medication(s)/side effects and non-pharmacologic comfort measures Outcome: Progressing   Problem: Health Behavior/Discharge Planning: Goal: Ability to manage health-related needs will improve Outcome: Progressing   Problem: Clinical Measurements: Goal: Will remain free from infection Outcome: Progressing Goal: Respiratory complications will improve Outcome: Progressing   Problem: Coping: Goal: Level of anxiety will decrease Outcome: Progressing   Problem: Safety: Goal: Ability to remain free from injury will improve Outcome: Progressing   Problem: Skin Integrity: Goal: Risk for impaired skin integrity will decrease Outcome: Progressing   "

## 2024-04-23 NOTE — Hospital Course (Signed)
 Audrey Malone is a 33 y.o. female with medical history significant for ADD, laparoscopic cholecystectomy on 04/10/2024, who is admitted to [facility] on 04/21/2024 with acute epigastric pain after presenting from home to [facility] ED complaining of abdominal pain.      Assessment and Plan:   Acute pancreatitis - Presumed retained/passed stone after recent cholecystectomy - LFTs suggestive of obstructive pattern on admission - MRCP negative for choledocholithiasis; no CBD dilation as well - Slow and steady improvement during hospitalization - LFTs slowly downtrended - Lipase also downtrended - Diet slowly advanced and she tolerated well

## 2024-04-24 LAB — COMPREHENSIVE METABOLIC PANEL WITH GFR
ALT: 299 U/L — ABNORMAL HIGH (ref 0–44)
AST: 82 U/L — ABNORMAL HIGH (ref 15–41)
Albumin: 3.1 g/dL — ABNORMAL LOW (ref 3.5–5.0)
Alkaline Phosphatase: 240 U/L — ABNORMAL HIGH (ref 38–126)
Anion gap: 12 (ref 5–15)
BUN: 9 mg/dL (ref 6–20)
CO2: 22 mmol/L (ref 22–32)
Calcium: 8.5 mg/dL — ABNORMAL LOW (ref 8.9–10.3)
Chloride: 104 mmol/L (ref 98–111)
Creatinine, Ser: 0.61 mg/dL (ref 0.44–1.00)
GFR, Estimated: >60 mL/min
Glucose, Bld: 67 mg/dL — ABNORMAL LOW (ref 70–99)
Potassium: 3.5 mmol/L (ref 3.5–5.1)
Sodium: 138 mmol/L (ref 135–145)
Total Bilirubin: 0.5 mg/dL (ref 0.0–1.2)
Total Protein: 5.9 g/dL — ABNORMAL LOW (ref 6.5–8.1)

## 2024-04-24 LAB — CBC WITH DIFFERENTIAL/PLATELET
Abs Immature Granulocytes: 0.03 K/uL (ref 0.00–0.07)
Basophils Absolute: 0 K/uL (ref 0.0–0.1)
Basophils Relative: 0 %
Eosinophils Absolute: 0.4 K/uL (ref 0.0–0.5)
Eosinophils Relative: 4 %
HCT: 32.1 % — ABNORMAL LOW (ref 36.0–46.0)
Hemoglobin: 10.9 g/dL — ABNORMAL LOW (ref 12.0–15.0)
Immature Granulocytes: 0 %
Lymphocytes Relative: 22 %
Lymphs Abs: 2.6 K/uL (ref 0.7–4.0)
MCH: 32 pg (ref 26.0–34.0)
MCHC: 34 g/dL (ref 30.0–36.0)
MCV: 94.1 fL (ref 80.0–100.0)
Monocytes Absolute: 0.8 K/uL (ref 0.1–1.0)
Monocytes Relative: 6 %
Neutro Abs: 7.9 K/uL — ABNORMAL HIGH (ref 1.7–7.7)
Neutrophils Relative %: 68 %
Platelets: 294 K/uL (ref 150–400)
RBC: 3.41 MIL/uL — ABNORMAL LOW (ref 3.87–5.11)
RDW: 13.8 % (ref 11.5–15.5)
WBC: 11.7 K/uL — ABNORMAL HIGH (ref 4.0–10.5)
nRBC: 0 % (ref 0.0–0.2)

## 2024-04-24 LAB — LIPASE, BLOOD: Lipase: 72 U/L — ABNORMAL HIGH (ref 11–51)

## 2024-04-24 LAB — MAGNESIUM: Magnesium: 1.6 mg/dL — ABNORMAL LOW (ref 1.7–2.4)

## 2024-04-24 NOTE — Progress Notes (Signed)
 Progress Note    Audrey Malone   FMW:[id number]  DOB: 1990-04-29  DOA: 04/21/2024     1 PCP: Ricky Alfrieda DASEN, DO  Initial CC: Epigastric pain  Hospital Course: Audrey Malone is a 33 y.o. female with medical history significant for ADD, laparoscopic cholecystectomy on 04/10/2024, who is admitted to [facility] on 04/21/2024 with acute epigastric pain after presenting from home to [facility] ED complaining of abdominal pain.      Assessment and Plan:   Acute pancreatitis - Presumed retained/passed stone after recent cholecystectomy - LFTs suggestive of obstructive pattern on admission - MRCP negative for choledocholithiasis; no CBD dilation as well - Not tolerating clear liquids as well as anticipated; continue for now and may advance later if patient desires - Continue pain and nausea control - Ambulate as tolerated - Continue fluids for now - Repeat LFTs in a.m.  Interval History:  No weight changes since yesterday.  Had some intolerance of clear liquids.  Not having much bowel output yet either.  Encouraged to continue ambulating more today and we will continue clear liquids but she is welcome to ask to advance later if begins tolerating better.    Antimicrobials:   DVT prophylaxis:  SCDs Start: 04/22/24 0306   Code Status:   Code Status: Full Code  Mobility Assessment (Last 72 Hours)     Mobility Assessment   No documentation.     Diet: Diet Orders (From admission, onward)     Start     Ordered   04/23/24 1009  Diet clear liquid Fluid consistency: Thin  Diet effective now       Question:  Fluid consistency:  Answer:  Thin   04/23/24 1009            Barriers to discharge: None Disposition Plan: Home HH orders placed: None Status is: Inpatient  Objective: Blood pressure 100/64, pulse 87, temperature 98.1 F (36.7 C), temperature source Oral, resp. rate 16, height 5' 5 (1.651 m), weight 90.1 kg, last menstrual period 04/19/2024, SpO2 97%.  Examination:   Physical Exam Constitutional:      Appearance: Normal appearance.  HENT:     Head: Normocephalic and atraumatic.     Mouth/Throat:     Mouth: Mucous membranes are moist.  Eyes:     Extraocular Movements: Extraocular movements intact.  Cardiovascular:     Rate and Rhythm: Normal rate and regular rhythm.  Pulmonary:     Effort: Pulmonary effort is normal. No respiratory distress.     Breath sounds: Normal breath sounds. No wheezing.  Abdominal:     General: Bowel sounds are normal. There is no distension.     Palpations: Abdomen is soft.     Tenderness: There is no abdominal tenderness.  Musculoskeletal:        General: Normal range of motion.     Cervical back: Normal range of motion and neck supple.  Skin:    General: Skin is warm and dry.  Neurological:     General: No focal deficit present.     Mental Status: She is alert.  Psychiatric:        Mood and Affect: Mood normal.      Consultants:  General surgery  Procedures:    Data Reviewed: Results for orders placed or performed during the hospital encounter of 04/21/24 (from the past 24 hours)  CBC with Differential/Platelet     Status: Abnormal   Collection Time: 04/24/24  4:25 AM  Result Value Ref Range  WBC 11.7 (H) 4.0 - 10.5 K/uL   RBC 3.41 (L) 3.87 - 5.11 MIL/uL   Hemoglobin 10.9 (L) 12.0 - 15.0 g/dL   HCT 67.8 (L) 63.9 - 53.9 %   MCV 94.1 80.0 - 100.0 fL   MCH 32.0 26.0 - 34.0 pg   MCHC 34.0 30.0 - 36.0 g/dL   RDW 86.1 88.4 - 84.4 %   Platelets 294 150 - 400 K/uL   nRBC 0.0 0.0 - 0.2 %   Neutrophils Relative % 68 %   Neutro Abs 7.9 (H) 1.7 - 7.7 K/uL   Lymphocytes Relative 22 %   Lymphs Abs 2.6 0.7 - 4.0 K/uL   Monocytes Relative 6 %   Monocytes Absolute 0.8 0.1 - 1.0 K/uL   Eosinophils Relative 4 %   Eosinophils Absolute 0.4 0.0 - 0.5 K/uL   Basophils Relative 0 %   Basophils Absolute 0.0 0.0 - 0.1 K/uL   Immature Granulocytes 0 %   Abs Immature Granulocytes 0.03 0.00 - 0.07 K/uL   Comprehensive metabolic panel with GFR     Status: Abnormal   Collection Time: 04/24/24  4:25 AM  Result Value Ref Range   Sodium 138 135 - 145 mmol/L   Potassium 3.5 3.5 - 5.1 mmol/L   Chloride 104 98 - 111 mmol/L   CO2 22 22 - 32 mmol/L   Glucose, Bld 67 (L) 70 - 99 mg/dL   BUN 9 6 - 20 mg/dL   Creatinine, Ser 9.38 0.44 - 1.00 mg/dL   Calcium 8.5 (L) 8.9 - 10.3 mg/dL   Total Protein 5.9 (L) 6.5 - 8.1 g/dL   Albumin 3.1 (L) 3.5 - 5.0 g/dL   AST 82 (H) 15 - 41 U/L   ALT 299 (H) 0 - 44 U/L   Alkaline Phosphatase 240 (H) 38 - 126 U/L   Total Bilirubin 0.5 0.0 - 1.2 mg/dL   GFR, Estimated >39 >39 mL/min   Anion gap 12 5 - 15  Magnesium     Status: Abnormal   Collection Time: 04/24/24  4:25 AM  Result Value Ref Range   Magnesium 1.6 (L) 1.7 - 2.4 mg/dL  Lipase, blood     Status: Abnormal   Collection Time: 04/24/24  4:25 AM  Result Value Ref Range   Lipase 72 (H) 11 - 51 U/L    I have reviewed pertinent nursing notes, vitals, labs, and images as necessary. I have ordered labwork to follow up on as indicated.  I have reviewed the last notes from staff over past 24 hours. I have discussed patient's care plan and test results with nursing staff, CM/SW, and other staff as appropriate.  Old records reviewed in assessment of this patient  Time spent: Greater than 50% of the 55 minute visit was spent in counseling/coordination of care for the patient as laid out in the A&P.   LOS: 1 day   Alm Apo, MD [facility] 04/24/2024, 1:34 PM

## 2024-04-25 ENCOUNTER — Encounter: Payer: Self-pay | Admitting: Internal Medicine

## 2024-04-25 DIAGNOSIS — K859 Acute pancreatitis without necrosis or infection, unspecified: Secondary | ICD-10-CM | POA: Diagnosis not present

## 2024-04-25 LAB — MAGNESIUM: Magnesium: 1.7 mg/dL (ref 1.7–2.4)

## 2024-04-25 LAB — CBC WITH DIFFERENTIAL/PLATELET
Abs Immature Granulocytes: 0.04 K/uL (ref 0.00–0.07)
Basophils Absolute: 0.1 K/uL (ref 0.0–0.1)
Basophils Relative: 1 %
Eosinophils Absolute: 0.6 K/uL — ABNORMAL HIGH (ref 0.0–0.5)
Eosinophils Relative: 6 %
HCT: 32.4 % — ABNORMAL LOW (ref 36.0–46.0)
Hemoglobin: 11.1 g/dL — ABNORMAL LOW (ref 12.0–15.0)
Immature Granulocytes: 0 %
Lymphocytes Relative: 33 %
Lymphs Abs: 3.1 K/uL (ref 0.7–4.0)
MCH: 31.4 pg (ref 26.0–34.0)
MCHC: 34.3 g/dL (ref 30.0–36.0)
MCV: 91.8 fL (ref 80.0–100.0)
Monocytes Absolute: 0.8 K/uL (ref 0.1–1.0)
Monocytes Relative: 8 %
Neutro Abs: 5 K/uL (ref 1.7–7.7)
Neutrophils Relative %: 52 %
Platelets: 308 K/uL (ref 150–400)
RBC: 3.53 MIL/uL — ABNORMAL LOW (ref 3.87–5.11)
RDW: 13.7 % (ref 11.5–15.5)
WBC: 9.5 K/uL (ref 4.0–10.5)
nRBC: 0 % (ref 0.0–0.2)

## 2024-04-25 LAB — COMPREHENSIVE METABOLIC PANEL WITH GFR
ALT: 225 U/L — ABNORMAL HIGH (ref 0–44)
AST: 48 U/L — ABNORMAL HIGH (ref 15–41)
Albumin: 3.2 g/dL — ABNORMAL LOW (ref 3.5–5.0)
Alkaline Phosphatase: 216 U/L — ABNORMAL HIGH (ref 38–126)
Anion gap: 14 (ref 5–15)
BUN: 6 mg/dL (ref 6–20)
CO2: 21 mmol/L — ABNORMAL LOW (ref 22–32)
Calcium: 8.6 mg/dL — ABNORMAL LOW (ref 8.9–10.3)
Chloride: 107 mmol/L (ref 98–111)
Creatinine, Ser: 0.62 mg/dL (ref 0.44–1.00)
GFR, Estimated: >60 mL/min
Glucose, Bld: 66 mg/dL — ABNORMAL LOW (ref 70–99)
Potassium: 3.7 mmol/L (ref 3.5–5.1)
Sodium: 142 mmol/L (ref 135–145)
Total Bilirubin: 0.4 mg/dL (ref 0.0–1.2)
Total Protein: 6 g/dL — ABNORMAL LOW (ref 6.5–8.1)

## 2024-04-25 MED ORDER — ONDANSETRON 4 MG PO TBDP: 4.0000 mg | ORAL_TABLET | Freq: Three times a day (TID) | ORAL | 0 refills | Status: AC | PRN

## 2024-04-25 NOTE — Discharge Summary (Signed)
 " Physician Discharge Summary   Audrey Malone FMW:[id number] DOB: 1990/11/26 DOA: 04/21/2024  PCP: Ricky Alfrieda DASEN, DO  Admit date: 04/21/2024 Discharge date: 04/25/2024  Admitted From: Home Disposition:  Home Discharging physician: Alm Apo, MD Barriers to discharge: none  Recommendations at discharge: Repeat LFTs at follow up    Discharge Condition: stable CODE STATUS: Full  Diet recommendation:  Diet Orders (From admission, onward)     Start     Ordered   04/25/24 0000  Diet general        04/25/24 0909   04/24/24 1403  DIET SOFT Room service appropriate? Yes; Fluid consistency: Thin  Diet effective now       Question Answer Comment  Room service appropriate? Yes   Fluid consistency: Thin      04/24/24 1402            Hospital Course: Audrey Malone is a 33 y.o. female with medical history significant for ADD, laparoscopic cholecystectomy on 04/10/2024, who is admitted to [facility] on 04/21/2024 with acute epigastric pain after presenting from home to [facility] ED complaining of abdominal pain.      Assessment and Plan:   Acute pancreatitis - Presumed retained/passed stone after recent cholecystectomy - LFTs suggestive of obstructive pattern on admission - MRCP negative for choledocholithiasis; no CBD dilation as well - Slow and steady improvement during hospitalization - LFTs slowly downtrended - Lipase also downtrended - Diet slowly advanced and she tolerated well   The patient's acute and chronic medical conditions were treated accordingly. On day of discharge, patient was felt deemed stable for discharge. Patient/family member advised to call PCP or come back to ER if needed.   Principal Diagnosis: Acute pancreatitis  Discharge Diagnoses: Active Hospital Problems   Diagnosis Date Noted   Acute pancreatitis 04/22/2024    Priority: 1.   Transaminitis 04/22/2024   Pyuria, sterile 04/22/2024   Attention deficit disorder 07/22/2016    Resolved  Hospital Problems   Diagnosis Date Noted Date Resolved   Nausea & vomiting 04/22/2024 04/25/2024   Abdominal pain 04/22/2024 04/25/2024     Discharge Instructions     Diet general   Complete by: As directed    Increase activity slowly   Complete by: As directed       Allergies as of 04/25/2024   No Known Allergies      Medication List     TAKE these medications    Blisovi Fe 1.5/30 1.5-30 MG-MCG tablet Generic drug: norethindrone-ethinyl estradiol-iron Take 1 tablet by mouth daily.   cholecalciferol 25 MCG (1000 UNIT) tablet Commonly known as: VITAMIN D3 Take 1,000 Units by mouth daily.   cyanocobalamin 1000 MCG tablet Commonly known as: VITAMIN B12 Take 1,000 mcg by mouth daily.   diphenhydrAMINE  25 MG tablet Commonly known as: BENADRYL  Take 25 mg by mouth at bedtime as needed for allergies.   escitalopram 10 MG tablet Commonly known as: LEXAPRO Take 10 mg by mouth daily.   methylphenidate  36 MG CR tablet Commonly known as: CONCERTA  Take 2 tablets (72 mg total) by mouth in the morning.   Multiple Vitamins/Womens tablet Take 1 tablet by mouth daily.   ondansetron  4 MG disintegrating tablet Commonly known as: ZOFRAN -ODT Take 1 tablet (4 mg total) by mouth every 8 (eight) hours as needed for nausea or vomiting. What changed:  when to take this reasons to take this   PEPPERMINT OIL PO Take 1 capsule by mouth daily.   TYLENOL  500 MG tablet Generic  drug: acetaminophen  Take 1,000 mg by mouth every 6 (six) hours as needed for mild pain (pain score 1-3).        Allergies[1]  Consultations:   Procedures:   Discharge Exam: BP 107/68 (BP Location: Left Arm)   Pulse 60   Temp 98 F (36.7 C) (Oral)   Resp 16   Ht 5' 5 (1.651 m)   Wt 90.1 kg   LMP 04/19/2024   SpO2 95%   BMI 33.05 kg/m  Physical Exam Constitutional:      Appearance: Normal appearance.  HENT:     Head: Normocephalic and atraumatic.     Mouth/Throat:     Mouth: Mucous  membranes are moist.  Eyes:     Extraocular Movements: Extraocular movements intact.  Cardiovascular:     Rate and Rhythm: Normal rate and regular rhythm.  Pulmonary:     Effort: Pulmonary effort is normal. No respiratory distress.     Breath sounds: Normal breath sounds. No wheezing.  Abdominal:     General: Bowel sounds are normal. There is no distension.     Palpations: Abdomen is soft.     Tenderness: There is no abdominal tenderness.  Musculoskeletal:        General: Normal range of motion.     Cervical back: Normal range of motion and neck supple.  Skin:    General: Skin is warm and dry.  Neurological:     General: No focal deficit present.     Mental Status: She is alert.  Psychiatric:        Mood and Affect: Mood normal.      The results of significant diagnostics from this hospitalization (including imaging, microbiology, ancillary and laboratory) are listed below for reference.   Microbiology: No results found for this or any previous visit (from the past 240 hours).   Labs: BNP (last 3 results) No results for input(s): BNP in the last 8760 hours. Basic Metabolic Panel: Recent Labs  Lab 04/21/24 2053 04/22/24 0437 04/23/24 0825 04/24/24 0425 04/25/24 0502  NA 142 142 141 138 142  K 4.1 4.1 3.3* 3.5 3.7  CL 106 107 106 104 107  CO2 25 24 23 22  21*  GLUCOSE 114* 150* 61* 67* 66*  BUN 14 14 16 9 6   CREATININE 0.95 0.79 0.68 0.61 0.62  CALCIUM 9.4 9.0 8.7* 8.5* 8.6*  MG  --  1.7  --  1.6* 1.7   Liver Function Tests: Recent Labs  Lab 04/21/24 2053 04/22/24 0437 04/23/24 0825 04/24/24 0425 04/25/24 0502  AST 856* 613* 166* 82* 48*  ALT 954* 800* 414* 299* 225*  ALKPHOS 412* 395* 284* 240* 216*  BILITOT 3.3* 2.3* 0.6 0.5 0.4  PROT 8.1 7.1 6.0* 5.9* 6.0*  ALBUMIN 4.2 3.8 3.2* 3.1* 3.2*   Recent Labs  Lab 04/21/24 2053 04/22/24 0437 04/23/24 0825 04/24/24 0425  LIPASE >2,800* >2,800* 179* 72*   No results for input(s): AMMONIA in the last  168 hours. CBC: Recent Labs  Lab 04/21/24 2053 04/22/24 0437 04/23/24 0825 04/24/24 0425 04/25/24 0502  WBC 20.2* 13.2* 10.7* 11.7* 9.5  NEUTROABS  --  11.8*  --  7.9* 5.0  HGB 14.2 12.7 11.4* 10.9* 11.1*  HCT 42.7 37.8 34.4* 32.1* 32.4*  MCV 94.9 94.0 94.0 94.1 91.8  PLT 406* 364 333 294 308   Cardiac Enzymes: No results for input(s): CKTOTAL, CKMB, CKMBINDEX, TROPONINI in the last 168 hours. BNP: Invalid input(s): POCBNP CBG: No results for input(s): GLUCAP in  the last 168 hours. D-Dimer No results for input(s): DDIMER in the last 72 hours. Hgb A1c No results for input(s): HGBA1C in the last 72 hours. Lipid Profile No results for input(s): CHOL, HDL, LDLCALC, TRIG, CHOLHDL, LDLDIRECT in the last 72 hours. Thyroid function studies No results for input(s): TSH, T4TOTAL, T3FREE, THYROIDAB in the last 72 hours.  Invalid input(s): FREET3 Anemia work up No results for input(s): VITAMINB12, FOLATE, FERRITIN, TIBC, IRON, RETICCTPCT in the last 72 hours. Urinalysis    Component Value Date/Time   COLORURINE AMBER (A) 04/21/2024 2102   APPEARANCEUR CLOUDY (A) 04/21/2024 2102   LABSPEC 1.018 04/21/2024 2102   PHURINE 5.0 04/21/2024 2102   GLUCOSEU NEGATIVE 04/21/2024 2102   HGBUR LARGE (A) 04/21/2024 2102   BILIRUBINUR SMALL (A) 04/21/2024 2102   KETONESUR NEGATIVE 04/21/2024 2102   PROTEINUR 30 (A) 04/21/2024 2102   NITRITE NEGATIVE 04/21/2024 2102   LEUKOCYTESUR MODERATE (A) 04/21/2024 2102   Sepsis Labs Recent Labs  Lab 04/22/24 0437 04/23/24 0825 04/24/24 0425 04/25/24 0502  WBC 13.2* 10.7* 11.7* 9.5   Microbiology No results found for this or any previous visit (from the past 240 hours).  Procedures/Studies: MR ABDOMEN MRCP W WO CONTAST Result Date: 04/22/2024 EXAM: MRCP WITH AND WITHOUT IV CONTRAST 04/22/2024 05:34:00 AM TECHNIQUE: Multisequence, multiplanar magnetic resonance images of the abdomen with and  without intravenous contrast. MRCP sequences were performed. 8 mL gadobutrol  (GADAVIST ) 1 MMOL/ML injection 8 mL GADOBUTROL  1 MMOL/ML IV SOLN. COMPARISON: 04/21/2024 status post cholecystectomy. CLINICAL HISTORY: Pancreatitis suspected; Evaluate for choledocholithiasis, status post cholecystectomy. FINDINGS: LIVER: Unremarkable. GALLBLADDER AND BILIARY SYSTEM: Status post cholecystectomy. Common bile duct measures up to 3 mm in maximum diameter. No signs of choledocholithiasis. A short segment of luminal narrowing of the distal common bile duct just before the ampulla is noted, which may reflect a stricture, possibly due to a recently passed stone. No intrahepatic or extrahepatic ductal dilation. SPLEEN: Unremarkable. PANCREAS/PANCREATIC DUCT: There is mild diffuse edema about the pancreas compatible with no acute pancreatitis. Small volume of peripancreatic soft tissue edema and fluid noted. No signs of pancreatic necrosis or pseudocyst. No main duct dilatation or mass. ADRENAL GLANDS: Unremarkable. KIDNEYS: Unremarkable. LYMPH NODES: No enlarged abdominal lymph nodes. VASCULATURE: Unremarkable. PERITONEUM: No significant fluid collections identified. ABDOMINAL WALL: No hernia. No mass. BOWEL: Grossly unremarkable. No bowel obstruction. BONES: No acute abnormality or worrisome osseous lesion. SOFT TISSUES: Unremarkable. MISCELLANEOUS: Unremarkable. IMPRESSION: 1. Mild diffuse pancreatic edema compatible with acute pancreatitis, with small volume peripancreatic soft tissue edema and fluid, and no pancreatic necrosis or pseudocyst. 2. Status post cholecystectomy. No common bile duct dilatation. 3. Short segment luminal narrowing of the distal common bile duct just proximal to the ampulla, which may reflect a stricture related to a recently passed stone, with no choledocholithiasis. Electronically signed by: Waddell Calk MD 04/22/2024 05:51 AM EST RP Workstation: [id number]   MR 3D Recon At Scanner Result Date:  04/22/2024 EXAM: MRCP WITH AND WITHOUT IV CONTRAST 04/22/2024 05:34:00 AM TECHNIQUE: Multisequence, multiplanar magnetic resonance images of the abdomen with and without intravenous contrast. MRCP sequences were performed. 8 mL gadobutrol  (GADAVIST ) 1 MMOL/ML injection 8 mL GADOBUTROL  1 MMOL/ML IV SOLN. COMPARISON: 04/21/2024 status post cholecystectomy. CLINICAL HISTORY: Pancreatitis suspected; Evaluate for choledocholithiasis, status post cholecystectomy. FINDINGS: LIVER: Unremarkable. GALLBLADDER AND BILIARY SYSTEM: Status post cholecystectomy. Common bile duct measures up to 3 mm in maximum diameter. No signs of choledocholithiasis. A short segment of luminal narrowing of the distal common bile duct just before the  ampulla is noted, which may reflect a stricture, possibly due to a recently passed stone. No intrahepatic or extrahepatic ductal dilation. SPLEEN: Unremarkable. PANCREAS/PANCREATIC DUCT: There is mild diffuse edema about the pancreas compatible with no acute pancreatitis. Small volume of peripancreatic soft tissue edema and fluid noted. No signs of pancreatic necrosis or pseudocyst. No main duct dilatation or mass. ADRENAL GLANDS: Unremarkable. KIDNEYS: Unremarkable. LYMPH NODES: No enlarged abdominal lymph nodes. VASCULATURE: Unremarkable. PERITONEUM: No significant fluid collections identified. ABDOMINAL WALL: No hernia. No mass. BOWEL: Grossly unremarkable. No bowel obstruction. BONES: No acute abnormality or worrisome osseous lesion. SOFT TISSUES: Unremarkable. MISCELLANEOUS: Unremarkable. IMPRESSION: 1. Mild diffuse pancreatic edema compatible with acute pancreatitis, with small volume peripancreatic soft tissue edema and fluid, and no pancreatic necrosis or pseudocyst. 2. Status post cholecystectomy. No common bile duct dilatation. 3. Short segment luminal narrowing of the distal common bile duct just proximal to the ampulla, which may reflect a stricture related to a recently passed stone,  with no choledocholithiasis. Electronically signed by: Waddell Calk MD 04/22/2024 05:51 AM EST RP Workstation: [id number]   CT ABDOMEN PELVIS W CONTRAST Result Date: 04/21/2024 EXAM: CT ABDOMEN AND PELVIS WITH CONTRAST 04/21/2024 10:47:05 PM TECHNIQUE: CT of the abdomen and pelvis was performed with the administration of 75 mL of iohexol  (OMNIPAQUE ) 350 MG/ML injection. Multiplanar reformatted images are provided for review. Automated exposure control, iterative reconstruction, and/or weight-based adjustment of the mA/kV was utilized to reduce the radiation dose to as low as reasonably achievable. COMPARISON: Comparison is made to 05/27/2017. CLINICAL HISTORY: Abdominal pain, acute, nonlocalized; Abdominal pain, post-op. Status post cholecystectomy April 12 2024 now with left lower quadrant abdominal pain and hematemesis. FINDINGS: LOWER CHEST: No acute abnormality. LIVER: The liver is unremarkable. GALLBLADDER AND BILE DUCTS: Status post cholecystectomy. Poorly circumscribed fluid collection is seen in the gallbladder fossa measuring 1.6 x 3.2 cm (series 23, image 3) also representing a postoperative hematoma or seroma. No intra or extrahepatic biliary ductal dilation. SPLEEN: No acute abnormality. PANCREAS: No acute abnormality. ADRENAL GLANDS: No acute abnormality. KIDNEYS, URETERS AND BLADDER: No stones in the kidneys or ureters. No hydronephrosis. No perinephric or periureteral stranding. Urinary bladder is unremarkable. GI AND BOWEL: Stomach demonstrates no acute abnormality. The small and large bowel are unremarkable. Appendix normal. There is no bowel obstruction. PERITONEUM AND RETROPERITONEUM: Small free intraperitoneal gas is seen within the lower abdomen. Trace free fluid within the pelvis is nonspecific. VASCULATURE: Aorta is normal in caliber. LYMPH NODES: No lymphadenopathy. REPRODUCTIVE ORGANS: No acute abnormality. BONES AND SOFT TISSUES: No acute osseous abnormality. Punctate foci of  subcutaneous gas seen within the left lower quadrant likely related to recent laparoscopic cholecystectomy. IMPRESSION: 1. Status post cholecystectomy with a 1.6 x 3.2 cm poorly circumscribed fluid collection in the gallbladder fossa, which may represent a postoperative hematoma or seroma 2. small free intraperitoneal and left lower quadrant subcutaneous gas likely related to recent laparoscopic surgery. Electronically signed by: Dorethia Molt MD 04/21/2024 10:57 PM EST RP Workstation: [id number]     Time coordinating discharge: Over 30 minutes    Alm Apo, MD  [facility] 04/25/2024, 2:28 PM    [1] No Known Allergies  "

## 2024-04-25 NOTE — Progress Notes (Signed)
 Discharge AVS reviewed with patient and give work note. Tylenol  and Zofran  as needed, follow-up with MD or ED if pain worsens, unable to keep po down, dizziness or lightheaded. Avoid fatty, greasy, and fried foods and eat small meals. Patient verbalizes understanding.
